# Patient Record
Sex: Female | Born: 1975 | Race: White | Hispanic: No | Marital: Married | Smoking: Never smoker
Health system: Southern US, Community
[De-identification: ages and names within clinical notes are randomized; demographics above are authoritative.]

## PROBLEM LIST (undated history)

## (undated) DIAGNOSIS — K219 Gastro-esophageal reflux disease without esophagitis: Secondary | ICD-10-CM

## (undated) HISTORY — DX: Gastro-esophageal reflux disease without esophagitis: K21.9

---

## 1997-01-08 HISTORY — PX: WISDOM TOOTH EXTRACTION: SHX21

## 2004-10-24 ENCOUNTER — Other Ambulatory Visit: Admission: RE | Admit: 2004-10-24 | Discharge: 2004-10-24 | Payer: Self-pay | Admitting: Obstetrics and Gynecology

## 2005-01-14 ENCOUNTER — Inpatient Hospital Stay (HOSPITAL_COMMUNITY): Admission: AD | Admit: 2005-01-14 | Discharge: 2005-01-14 | Payer: Self-pay | Admitting: Obstetrics and Gynecology

## 2005-02-15 ENCOUNTER — Other Ambulatory Visit: Admission: RE | Admit: 2005-02-15 | Discharge: 2005-02-15 | Payer: Self-pay | Admitting: Obstetrics and Gynecology

## 2005-05-14 ENCOUNTER — Inpatient Hospital Stay (HOSPITAL_COMMUNITY): Admission: RE | Admit: 2005-05-14 | Discharge: 2005-05-17 | Payer: Self-pay | Admitting: Obstetrics and Gynecology

## 2007-07-21 ENCOUNTER — Emergency Department (HOSPITAL_COMMUNITY): Admission: EM | Admit: 2007-07-21 | Discharge: 2007-07-21 | Payer: Self-pay | Admitting: Emergency Medicine

## 2007-11-24 ENCOUNTER — Emergency Department (HOSPITAL_COMMUNITY): Admission: EM | Admit: 2007-11-24 | Discharge: 2007-11-24 | Payer: Self-pay | Admitting: Family Medicine

## 2007-12-09 ENCOUNTER — Emergency Department (HOSPITAL_COMMUNITY): Admission: EM | Admit: 2007-12-09 | Discharge: 2007-12-09 | Payer: Self-pay | Admitting: Family Medicine

## 2008-05-24 ENCOUNTER — Emergency Department (HOSPITAL_COMMUNITY): Admission: EM | Admit: 2008-05-24 | Discharge: 2008-05-24 | Payer: Self-pay | Admitting: Emergency Medicine

## 2009-04-13 ENCOUNTER — Observation Stay (HOSPITAL_COMMUNITY): Admission: AD | Admit: 2009-04-13 | Discharge: 2009-04-14 | Payer: Self-pay | Admitting: Obstetrics and Gynecology

## 2009-04-13 ENCOUNTER — Emergency Department (HOSPITAL_COMMUNITY): Admission: EM | Admit: 2009-04-13 | Discharge: 2009-04-13 | Payer: Self-pay | Admitting: Emergency Medicine

## 2009-06-04 ENCOUNTER — Inpatient Hospital Stay (HOSPITAL_COMMUNITY): Admission: AD | Admit: 2009-06-04 | Discharge: 2009-06-06 | Payer: Self-pay | Admitting: Obstetrics and Gynecology

## 2009-06-10 ENCOUNTER — Ambulatory Visit: Admission: RE | Admit: 2009-06-10 | Discharge: 2009-06-10 | Payer: Self-pay | Admitting: Obstetrics and Gynecology

## 2010-03-19 ENCOUNTER — Inpatient Hospital Stay (INDEPENDENT_AMBULATORY_CARE_PROVIDER_SITE_OTHER)
Admission: RE | Admit: 2010-03-19 | Discharge: 2010-03-19 | Disposition: A | Discharge status: Home / Self Care | Payer: 59 | Source: Ambulatory Visit | Attending: Family Medicine | Admitting: Family Medicine

## 2010-03-19 DIAGNOSIS — J029 Acute pharyngitis, unspecified: Secondary | ICD-10-CM

## 2010-03-19 LAB — POCT INFECTIOUS MONO SCREEN: Mono Screen: NEGATIVE

## 2010-03-27 LAB — RPR: RPR Ser Ql: NONREACTIVE

## 2010-03-27 LAB — CBC
MCV: 94 fL (ref 78.0–100.0)
WBC: 8 10*3/uL (ref 4.0–10.5)

## 2010-03-29 LAB — DIFFERENTIAL
Basophils Absolute: 0 10*3/uL (ref 0.0–0.1)
Eosinophils Absolute: 0 10*3/uL (ref 0.0–0.7)
Eosinophils Relative: 0 % (ref 0–5)
Eosinophils Relative: 0 % (ref 0–5)
Lymphocytes Relative: 14 % (ref 12–46)
Lymphocytes Relative: 14 % (ref 12–46)
Lymphs Abs: 2.2 10*3/uL (ref 0.7–4.0)
Monocytes Relative: 6 % (ref 3–12)
Neutro Abs: 12.6 10*3/uL — ABNORMAL HIGH (ref 1.7–7.7)

## 2010-03-29 LAB — CBC
HCT: 34.7 % — ABNORMAL LOW (ref 36.0–46.0)
HCT: 38.2 % (ref 36.0–46.0)
MCHC: 34.2 g/dL (ref 30.0–36.0)
MCV: 96.2 fL (ref 78.0–100.0)
Platelets: 287 10*3/uL (ref 150–400)
RBC: 4 MIL/uL (ref 3.87–5.11)
RDW: 12.5 % (ref 11.5–15.5)
RDW: 12.8 % (ref 11.5–15.5)
WBC: 15.6 10*3/uL — ABNORMAL HIGH (ref 4.0–10.5)

## 2010-05-26 NOTE — Discharge Summary (Signed)
Traci White, Traci White                ACCOUNT NO.:  [id number]   MEDICAL RECORD NO.:  [id number]          PATIENT TYPE:  INP   LOCATION:  9107                          FACILITY:  [facility]   PHYSICIAN:  Guy Sandifer. Henderson Cloud, M.D. DATE OF BIRTH:  02-05-1975   DATE OF ADMISSION:  05/14/2005  DATE OF DISCHARGE:  05/17/2005                                 DISCHARGE SUMMARY   ADMITTING DIAGNOSIS:  1.  Intrauterine pregnancy at term.  2.  Low-lying placenta.   DISCHARGE DIAGNOSIS:  1.  Status post low transverse cesarean section.  2.  Viable female infant.   PROCEDURE:  Primary low transverse cesarean section.   REASON FOR ADMISSION:  Please see written H&P.   HOSPITAL COURSE:  Patient is a 35 year old primigravida that was admitted to  [facility] for a scheduled cesarean section.  Patient had  known low-lying placenta and was scheduled for cesarean delivery.  Patient  presented to [facility] that morning and was taken to the  operating room where spinal anesthesia was administered without difficulty.  A low transverse incision was made with delivery of a viable female infant  weighing 8 pounds 5 ounces, Apgars of 9 at one minute, 9 at five minutes.  Patient tolerated procedure well and was taken to the recovery room in  stable condition.  On postoperative day one patient was without complaint.  Vital signs were stable.  She was afebrile.  Abdomen soft with good return  of bowel function.  Fundus was firm and nontender.  Abdominal dressing was  noted to be partially saturated with old blood.  Dressing was removed and  was observed to have no active bleeding.  Patient was ambulating well.  Laboratory findings revealed hemoglobin of 12.9, platelet count of 204,000,  WBC count of 12.  On postoperative day two patient did complain of some  soreness.  Vital signs were stable.  She was afebrile.  Abdomen was soft.  Fundus was firm and nontender.  Incision was clean,  dry, and intact.  She  was ambulating well and tolerating a regular diet without complaints of  nausea and vomiting.  On postoperative day three patient was without  complaint.  Vital signs remained stable.  She was afebrile.  Abdomen soft.  Fundus firm and nontender.  Incision was clean, dry, and intact.  Staples  removed and patient was discharged home.   CONDITION ON DISCHARGE:  Good.   DIET:  Regular, as tolerated.   ACTIVITY:  No heavy lifting, no driving x2 weeks, no vaginal entry.   FOLLOW UP:  Patient to follow up in the office in one to two weeks for  incision check.  She is to call for temperature greater than 100 degrees,  persistent nausea and vomiting, heavy vaginal bleeding and/or redness or  drainage from the incisional site.   DISCHARGE MEDICATIONS:  1.  Percocet 5/325 #30 one p.o. every four to six hours p.r.n.  2.  Motrin 600 mg every six hours.  3.  Prenatal vitamins one p.o. daily.  4.  Colace one p.o. daily p.r.n.  Julio Sicks, N.P.      Guy Sandifer. Henderson Cloud, M.D.  Electronically Signed    CC/MEDQ  D:  06/05/2005  T:  06/05/2005  Job:  [id number]

## 2010-05-26 NOTE — Op Note (Signed)
Traci White, RASKE                ACCOUNT NO.:  [id number]   MEDICAL RECORD NO.:  [id number]          PATIENT TYPE:  INP   LOCATION:  9107                          [facility]ILITY:  [facility]   PHYSICIAN:  Michelle L. Grewal, M.D.DATE OF BIRTH:  06-Nov-1975   DATE OF PROCEDURE:  05/14/2005  DATE OF DISCHARGE:                                 OPERATIVE REPORT   PREOPERATIVE DIAGNOSIS:  Intrauterine pregnancy at term and low lying  placenta.   POSTOPERATIVE DIAGNOSIS:  Intrauterine pregnancy at term and low lying  placenta.   PROCEDURE:  Primary low transverse cesarean section.   SURGEON:  Michelle L. Vincente Poli, M.D.   ANESTHESIA:  Spinal.   SPECIMENS:  Female infant, cephalic presentation Apgars 9 at one minute and  9 at five minutes.   ESTIMATED BLOOD LOSS:  500 mL.   COMPLICATIONS:  None.   PROCEDURE:  Patient was taken to the operating room.  She was given spinal.  She is prepped and draped in usual sterile fashion.  A Foley catheter was  inserted.  A low transverse incision is made, carried down to the fascia.  Fascia scored in midline and extended laterally. Rectus muscles were  separated in midline and the peritoneum was entered bluntly.  Peritoneal  incision was stretched.  The bladder blade was inserted. The lower uterine  segment identified and the bladder flap was created sharply and then  digitally.  The bladder blade was readjusted.  A low transverse incision is  made in the uterus.  The uterus was entered with the hemostat.  Amniotic  fluid was clear and the baby was in cephalic presentation was delivered  quite easily and was a female infant with Apgars of 9 at one minute and 9 at  five minutes. The cord was clamped and cut. The baby was handed to _____  from the NICU and subsequently taken to the newborn nursery. Exploration of  the uterus revealed the placenta was posterior and very low lying and after  cord blood was obtained manually, we actually expressed the placenta.  It  appeared to be normal with a three-vessel cord.  The uterus was exteriorized  and cleared of all clots and debris.  The uterus was closed in one layer  using 0 chromic in continuous running locked stitch.  Irrigation performed.  Hemostasis was noted.  The peritoneum was closed using 0 Vicryl.  The rectus  muscles were reapproximated using same 0 Vicryl.  The fascia closed using 0  Vicryl x2 starting at each corner and meeting in the midline. After  irrigation of the subcutaneous layer, the skin was closed with staples.  All  sponge, lap and instrument counts were correct x2.  The patient went to  recovery room stable condition.     Michelle L. Vincente Poli, M.D.  Electronically Signed    MLG/MEDQ  D:  05/14/2005  T:  05/15/2005  Job:  [id number]

## 2010-10-05 LAB — POCT URINALYSIS DIP (DEVICE)
Bilirubin Urine: NEGATIVE
Nitrite: POSITIVE — AB
Protein, ur: NEGATIVE

## 2010-10-05 LAB — POCT PREGNANCY, URINE: Preg Test, Ur: NEGATIVE

## 2011-03-29 ENCOUNTER — Other Ambulatory Visit: Payer: Self-pay | Admitting: Family Medicine

## 2011-03-29 ENCOUNTER — Ambulatory Visit
Admission: RE | Admit: 2011-03-29 | Discharge: 2011-03-29 | Disposition: A | Discharge status: Home / Self Care | Payer: 59 | Source: Ambulatory Visit | Attending: Family Medicine | Admitting: Family Medicine

## 2011-03-29 DIAGNOSIS — R52 Pain, unspecified: Secondary | ICD-10-CM

## 2015-01-14 MED FILL — PANTOPRAZOLE SOD DR 40 MG T: 40 | 90 days supply | Qty: 90 | Fill #1

## 2015-01-14 MED FILL — PAZEO 0.7% EYE DROPS: 0.7 | 25 days supply | Qty: 3 | Fill #4

## 2015-02-06 DIAGNOSIS — K1121 Acute sialoadenitis: Secondary | ICD-10-CM | POA: Diagnosis not present

## 2015-02-07 MED FILL — PAZEO 0.7% EYE DROPS: 0.7 | 25 days supply | Qty: 3 | Fill #5

## 2015-02-08 DIAGNOSIS — K1120 Sialoadenitis, unspecified: Secondary | ICD-10-CM | POA: Diagnosis not present

## 2015-03-04 MED FILL — PAZEO 0.7% EYE DROPS: 0.7 | 25 days supply | Qty: 3 | Fill #6

## 2015-03-10 MED FILL — DOXYCYCLINE HYC 50 MG CAP: 50 | 90 days supply | Qty: 90 | Fill #5

## 2015-03-10 MED FILL — LARIN FE 1-20 TABLET: 1-20 | 84 days supply | Qty: 84 | Fill #2

## 2015-03-25 MED FILL — PAZEO 0.7% EYE DROPS: 0.7 | 25 days supply | Qty: 3 | Fill #0

## 2015-03-28 DIAGNOSIS — B300 Keratoconjunctivitis due to adenovirus: Secondary | ICD-10-CM | POA: Diagnosis not present

## 2015-03-28 DIAGNOSIS — H04123 Dry eye syndrome of bilateral lacrimal glands: Secondary | ICD-10-CM | POA: Diagnosis not present

## 2015-04-18 MED FILL — PAZEO 0.7% EYE DROPS: 0.7 | 25 days supply | Qty: 3 | Fill #1

## 2015-04-18 MED FILL — PANTOPRAZOLE SOD DR 40 MG T: 40 | 90 days supply | Qty: 90 | Fill #2

## 2015-04-20 DIAGNOSIS — Z8719 Personal history of other diseases of the digestive system: Secondary | ICD-10-CM | POA: Diagnosis not present

## 2015-04-20 DIAGNOSIS — Z09 Encounter for follow-up examination after completed treatment for conditions other than malignant neoplasm: Secondary | ICD-10-CM | POA: Diagnosis not present

## 2015-05-10 MED FILL — PAZEO 0.7% EYE DROPS: 0.7 | 25 days supply | Qty: 3 | Fill #2

## 2015-05-10 MED FILL — RESTASIS 0.05% EYE EMULSION: 0.05 | 90 days supply | Qty: 180 | Fill #2

## 2015-06-03 MED FILL — PAZEO 0.7% EYE DROPS: 0.7 | 25 days supply | Qty: 3 | Fill #3

## 2015-06-03 MED FILL — NORETHIN-ESTRAD-FERR 1-0.02: 1-20 | 84 days supply | Qty: 84 | Fill #3

## 2015-06-27 MED FILL — DOXYCYCLINE HYC 50 MG CAP: 50 | 90 days supply | Qty: 90 | Fill #6

## 2015-06-27 MED FILL — PAZEO 0.7% EYE DROPS: 0.7 | 25 days supply | Qty: 3 | Fill #4

## 2015-07-20 MED FILL — PAZEO 0.7% EYE DROPS: 0.7 | 25 days supply | Qty: 3 | Fill #5

## 2015-07-26 MED FILL — XIIDRA 5% EYE DROPS: 5 | 30 days supply | Qty: 60 | Fill #0

## 2015-08-17 MED FILL — PANTOPRAZOLE SOD DR 40 MG T: 40 | 90 days supply | Qty: 90 | Fill #3

## 2015-08-17 MED FILL — NORETHIN-ESTRAD-FERR 1-0.02: 1-20 | 84 days supply | Qty: 84 | Fill #4

## 2015-08-21 DIAGNOSIS — H9202 Otalgia, left ear: Secondary | ICD-10-CM | POA: Diagnosis not present

## 2015-08-21 DIAGNOSIS — R609 Edema, unspecified: Secondary | ICD-10-CM | POA: Diagnosis not present

## 2015-09-13 DIAGNOSIS — Z6828 Body mass index (BMI) 28.0-28.9, adult: Secondary | ICD-10-CM | POA: Diagnosis not present

## 2015-09-13 DIAGNOSIS — Z01419 Encounter for gynecological examination (general) (routine) without abnormal findings: Secondary | ICD-10-CM | POA: Diagnosis not present

## 2015-09-19 ENCOUNTER — Ambulatory Visit (INDEPENDENT_AMBULATORY_CARE_PROVIDER_SITE_OTHER): Payer: 59 | Admitting: Podiatry

## 2015-09-20 DIAGNOSIS — Z131 Encounter for screening for diabetes mellitus: Secondary | ICD-10-CM | POA: Diagnosis not present

## 2015-09-20 DIAGNOSIS — Z1321 Encounter for screening for nutritional disorder: Secondary | ICD-10-CM | POA: Diagnosis not present

## 2015-09-20 DIAGNOSIS — Z1329 Encounter for screening for other suspected endocrine disorder: Secondary | ICD-10-CM | POA: Diagnosis not present

## 2015-09-20 DIAGNOSIS — Z13220 Encounter for screening for lipoid disorders: Secondary | ICD-10-CM | POA: Diagnosis not present

## 2015-09-21 DIAGNOSIS — H16223 Keratoconjunctivitis sicca, not specified as Sjogren's, bilateral: Secondary | ICD-10-CM | POA: Diagnosis not present

## 2015-09-21 DIAGNOSIS — H01022 Squamous blepharitis right lower eyelid: Secondary | ICD-10-CM | POA: Diagnosis not present

## 2015-09-21 DIAGNOSIS — H01025 Squamous blepharitis left lower eyelid: Secondary | ICD-10-CM | POA: Diagnosis not present

## 2015-09-30 ENCOUNTER — Ambulatory Visit (INDEPENDENT_AMBULATORY_CARE_PROVIDER_SITE_OTHER): Payer: 59 | Admitting: Podiatry

## 2015-09-30 ENCOUNTER — Encounter: Payer: Self-pay | Admitting: Podiatry

## 2015-09-30 ENCOUNTER — Ambulatory Visit (INDEPENDENT_AMBULATORY_CARE_PROVIDER_SITE_OTHER): Payer: 59

## 2015-09-30 VITALS — BP 125/88 | HR 107 | Resp 16 | Ht 63.25 in | Wt 165.0 lb

## 2015-09-30 DIAGNOSIS — M79671 Pain in right foot: Secondary | ICD-10-CM | POA: Diagnosis not present

## 2015-09-30 DIAGNOSIS — M79672 Pain in left foot: Secondary | ICD-10-CM | POA: Diagnosis not present

## 2015-09-30 DIAGNOSIS — M21619 Bunion of unspecified foot: Secondary | ICD-10-CM

## 2015-09-30 NOTE — Progress Notes (Signed)
   Subjective:    Patient ID: Traci White, female    DOB: 05/07/1975, 40 y.o.   MRN: [id number]  HPI Chief Complaint  Patient presents with  . Foot Pain    Bilateral; Bunions; pt stated, "Pain comes and goes; Right foot hurts more than left"      Review of Systems  Constitutional: Positive for fatigue.  Musculoskeletal: Positive for arthralgias.  All other systems reviewed and are negative.      Objective:   Physical Exam        Assessment & Plan:

## 2015-10-03 NOTE — Progress Notes (Signed)
Subjective:     Patient ID: Traci White, female   DOB: June 16, 1975, 40 y.o.   MRN: [id number]  HPI patient presents with structural bunion deformity right over left with redness and pain and states that she's been trying wider shoes soaks without relief of symptoms and it's been present for several years and getting worse   Review of Systems  All other systems reviewed and are negative.      Objective:   Physical Exam  Constitutional: She is oriented to person, place, and time.  Cardiovascular: Intact distal pulses.   Musculoskeletal: Normal range of motion.  Neurological: She is oriented to person, place, and time.  Skin: Skin is warm.  Nursing note and vitals reviewed.  neurovascular status found to be intact with muscle strength adequate range of motion within normal limits with patient noted to have redness and pain first metatarsal right over left around the first metatarsal bone with pain upon palpation and deviation of the hallux against the second toe. 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 I do think distal osteotomy can be accomplished and I reviewed procedure with patient and she wants this done and at this time she'll reappoint for consult and is scheduled for outpatient surgery. Gave her all instructions as to what we will need to be done and we will see her back for extensive consultation  X-ray report indicates there is structural bunion deformity bilateral with elevation of the angle between the first and second metatarsal 16 on right and 15 on the left approximate

## 2015-10-04 MED FILL — DOXYCYCLINE HYC 50 MG CAP: 50 | 90 days supply | Qty: 90 | Fill #0

## 2015-10-13 ENCOUNTER — Ambulatory Visit (INDEPENDENT_AMBULATORY_CARE_PROVIDER_SITE_OTHER): Payer: 59 | Admitting: Podiatry

## 2015-10-13 ENCOUNTER — Encounter: Payer: Self-pay | Admitting: Podiatry

## 2015-10-13 DIAGNOSIS — M21619 Bunion of unspecified foot: Secondary | ICD-10-CM | POA: Diagnosis not present

## 2015-10-13 MED FILL — PAZEO 0.7% EYE DROPS: 0.7 | 25 days supply | Qty: 3 | Fill #6

## 2015-10-13 NOTE — Patient Instructions (Signed)
Pre-Operative Instructions  Congratulations, you have decided to take an important step to improving your quality of life.  You can be assured that the doctors of [facility] will be with you every step of the way.  1. Plan to be at the surgery center/hospital at least 1 (one) hour prior to your scheduled time unless otherwise directed by the surgical center/hospital staff.  You must have a responsible adult accompany you, remain during the surgery and drive you home.  Make sure you have directions to the surgical center/hospital and know how to get there on time. 2. For hospital based surgery you will need to obtain a history and physical form from your family physician within 1 month prior to the date of surgery- we will give you a form for you primary physician.  3. We make every effort to accommodate the date you request for surgery.  There are however, times where surgery dates or times have to be moved.  We will contact you as soon as possible if a change in schedule is required.   4. No Aspirin/Ibuprofen for one week before surgery.  If you are on aspirin, any non-steroidal anti-inflammatory medications (Mobic, Aleve, Ibuprofen) you should stop taking it 7 days prior to your surgery.  You make take Tylenol  For pain prior to surgery.  5. Medications- If you are taking daily heart and blood pressure medications, seizure, reflux, allergy, asthma, anxiety, pain or diabetes medications, make sure the surgery center/hospital is aware before the day of surgery so they may notify you which medications to take or avoid the day of surgery. 6. No food or drink after midnight the night before surgery unless directed otherwise by surgical center/hospital staff. 7. No alcoholic beverages 24 hours prior to surgery.  No smoking 24 hours prior to or 24 hours after surgery. 8. Wear loose pants or shorts- loose enough to fit over bandages, boots, and casts. 9. No slip on shoes, sneakers are best. 10. Bring  your boot with you to the surgery center/hospital.  Also bring crutches or a walker if your physician has prescribed it for you.  If you do not have this equipment, it will be provided for you after surgery. 11. If you have not been contracted by the surgery center/hospital by the day before your surgery, call to confirm the date and time of your surgery. 12. Leave-time from work may vary depending on the type of surgery you have.  Appropriate arrangements should be made prior to surgery with your employer. 13. Prescriptions will be provided immediately following surgery by your doctor.  Have these filled as soon as possible after surgery and take the medication as directed. 14. Remove nail polish on the operative foot. 15. Wash the night before surgery.  The night before surgery wash the foot and leg well with the antibacterial soap provided and water paying special attention to beneath the toenails and in between the toes.  Rinse thoroughly with water and dry well with a towel.  Perform this wash unless told not to do so by your physician.  Enclosed: 1 Ice pack (please put in freezer the night before surgery)   1 Hibiclens skin cleaner   Pre-op Instructions  If you have any questions regarding the instructions, do not hesitate to call our office.  [place]: [address] [place], [place] [address] [phone]  [place]: [address] , [place] [address] [phone]  [place]: [address]  [place], [place] [address] [phone]   Dr.   Norman Regal DPM, Dr. Matthew Wagoner DPM, Dr. M. Todd Hyatt DPM, Dr. Titorya Stover DPM 

## 2015-10-16 NOTE — Progress Notes (Signed)
Subjective:     Patient ID: Traci MacadamiaSusan H White, female   DOB: April 25, 1975, 40 y.o.   MRN: [id number]  HPI patient presents stating she's ready to get her bunion done and at this point wants to do the right one first as it bothers her more   Review of Systems     Objective:   Physical Exam Neurovascular status intact with patient found to have painful right and left first MPJ with redness around the joint surface and pain when palpated    Assessment:     Structural bunion deformity right over left with elevation of the intermetatarsal angle    Plan:     H&P condition reviewed and recommended correction. Patient wants surgery and I allowed her to read consent form reviewing alternative treatments complications and she understands what we'll be done and all complications as listed. She is willing to accept risk signs consent form and understands recovery is a proximally 6 months and is dispensed air fracture walker at this time with instructions on usage. Patient is encouraged to call with any questions and will be seen back for surgery in the next 4 weeks or earlier if she can change her schedule

## 2015-10-18 DIAGNOSIS — M3500 Sicca syndrome, unspecified: Secondary | ICD-10-CM | POA: Diagnosis not present

## 2015-10-18 DIAGNOSIS — R5383 Other fatigue: Secondary | ICD-10-CM | POA: Diagnosis not present

## 2015-10-18 DIAGNOSIS — R0602 Shortness of breath: Secondary | ICD-10-CM | POA: Diagnosis not present

## 2015-10-18 DIAGNOSIS — K111 Hypertrophy of salivary gland: Secondary | ICD-10-CM | POA: Diagnosis not present

## 2015-10-21 ENCOUNTER — Telehealth: Payer: Self-pay | Admitting: *Deleted

## 2015-10-21 NOTE — Telephone Encounter (Signed)
"  I'm going to have surgery by Dr. Charlsie Merlesegal on November 21.  I just have questions.  I called the surgery center and they have not received any of my documents.  I believe that is because I just signed the consents last week sometime.  I am trying to figure out the cost of the surgery.  They said they need the documents in order to tell me that.  I just want to make sure everything is still a go for that date."

## 2015-10-27 NOTE — Telephone Encounter (Signed)
I am returning your call.  We do have you scheduled for November 21.  Information was sent to the surgical center today.  I apologize for the delay in calling you back.  We are short staffed so I have been assisting the doctors.  If you have any further questions give me a call back on Monday.  I will be out of the office tomorrow.

## 2015-11-01 ENCOUNTER — Telehealth: Payer: Self-pay | Admitting: *Deleted

## 2015-11-01 NOTE — Telephone Encounter (Signed)
"  I was just returning your call.  Thank you so much for getting that information over to the surgical center.  I am sorry if I was being a pest.  I was trying to get an estimate of my costs.  I am assuming they have sent my FMLA papers and short term disability stuff was sent to you guys.  If you could check on that for me and let me know if I need to send anything else that would be great."

## 2015-11-02 ENCOUNTER — Telehealth: Payer: Self-pay | Admitting: *Deleted

## 2015-11-02 MED FILL — RESTASIS 0.05% EYE EMULSION: 0.05 | 90 days supply | Qty: 180 | Fill #0

## 2015-11-02 NOTE — Telephone Encounter (Signed)
"  I know I have become a thorn in your side.  [employer] and [employer] have been calling me asking for stuff from you guys.  They keep telling me I need to call and see if it is being taken care of.  So I apologize."  We do have the forms.  Traci CrockerJessica White has been working on them today.  "Okay great, thank you so much for your help."

## 2015-11-08 DIAGNOSIS — R5383 Other fatigue: Secondary | ICD-10-CM | POA: Diagnosis not present

## 2015-11-08 DIAGNOSIS — M3500 Sicca syndrome, unspecified: Secondary | ICD-10-CM | POA: Diagnosis not present

## 2015-11-08 DIAGNOSIS — K111 Hypertrophy of salivary gland: Secondary | ICD-10-CM | POA: Diagnosis not present

## 2015-11-09 HISTORY — PX: BUNIONECTOMY: SHX129

## 2015-11-16 ENCOUNTER — Encounter: Payer: 59 | Attending: Family Medicine | Admitting: Skilled Nursing Facility1

## 2015-11-16 ENCOUNTER — Encounter: Payer: Self-pay | Admitting: Skilled Nursing Facility1

## 2015-11-16 DIAGNOSIS — Z713 Dietary counseling and surveillance: Secondary | ICD-10-CM | POA: Insufficient documentation

## 2015-11-16 NOTE — Progress Notes (Signed)
Patient was seen on 11/16/2015 for the Weight Loss Class at the [facility]. The following learning objectives were met by the patient during this class:   Describe healthy choices in each food group  Describe portion size of foods  Use plate method for meal planning  Demonstrate how to read Nutrition Facts food label  Set realistic goals for weight loss, diet changes, and physical activity.   Goals:  1. Make healthy food choices in each food group.  2. Reduce portion size of foods.  3. Increase fruit and vegetable intake.  4. Use plate method for meal planning.  5. Increase physical activity.    Handouts given:   1. Nutrition Strategies for Weight Loss   2. Meal plan/portion card   3. MyPlate Planner   4. Weight Management Recipe Resources   5. Bake, Broil, Spring

## 2015-11-18 ENCOUNTER — Telehealth: Payer: Self-pay | Admitting: *Deleted

## 2015-11-18 MED FILL — PANTOPRAZOLE SOD DR 40 MG T: 40 | 90 days supply | Qty: 90 | Fill #0

## 2015-11-18 MED FILL — PAZEO 0.7% EYE DROPS: 0.7 | 25 days supply | Qty: 3 | Fill #0

## 2015-11-18 MED FILL — NORETHIN-ESTRAD-FERR 1-0.02: 1-20 | 84 days supply | Qty: 84 | Fill #0

## 2015-11-18 NOTE — Telephone Encounter (Signed)
"  I have some questions.  Please call."    I'm returning your call.  How can I help you?  "I take fish oil for dry eye is it okay for me to continue taking this prior to surgery scheduled for 11/29/2015?  Yes, it is okay to continue to take this.  "What about Ibuprofen, should I not take it?" Try to avoid taking Ibuprofen if you can.  " Oh, it's no problem.  I have the scrub brush.  It says to use it the night before.  Is it okay for me to use it that morning.  I normally take my shower in the morning."  Please use it the night before.  "Okay, thank you."

## 2015-11-28 ENCOUNTER — Telehealth: Payer: Self-pay | Admitting: *Deleted

## 2015-11-28 NOTE — Telephone Encounter (Signed)
Pt asked if she needed to purchase a bag to shower after surgery. I told pt she could purchase a cast bag here or any pharmacy.

## 2015-11-29 ENCOUNTER — Encounter: Payer: Self-pay | Admitting: Podiatry

## 2015-11-29 DIAGNOSIS — M2011 Hallux valgus (acquired), right foot: Secondary | ICD-10-CM | POA: Diagnosis not present

## 2015-11-29 DIAGNOSIS — M722 Plantar fascial fibromatosis: Secondary | ICD-10-CM | POA: Diagnosis not present

## 2015-11-29 DIAGNOSIS — M21611 Bunion of right foot: Secondary | ICD-10-CM | POA: Diagnosis not present

## 2015-11-29 DIAGNOSIS — K219 Gastro-esophageal reflux disease without esophagitis: Secondary | ICD-10-CM | POA: Diagnosis not present

## 2015-11-29 MED FILL — OXYCODONE-APAP 10-325: 10-325 | 5 days supply | Qty: 30 | Fill #0

## 2015-11-29 MED FILL — PROMETHAZINE 25 MG TABLET: 25 | 7 days supply | Qty: 30 | Fill #0

## 2015-12-05 ENCOUNTER — Telehealth: Payer: Self-pay | Admitting: *Deleted

## 2015-12-05 MED ORDER — OXYCODONE-ACETAMINOPHEN 5-325 MG PO TABS: ORAL_TABLET | ORAL | 0 refills | Status: DC

## 2015-12-05 MED FILL — OXYCODONE W/APAP 5/325 TAB: 5-325 | 5 days supply | Qty: 30 | Fill #0

## 2015-12-05 NOTE — Telephone Encounter (Addendum)
Pt called 12/02/2015 our office was closed for the holiday. Pt called for refill of Percocet 5/325. I refilled and pt states she has tried to make it to every 8 hours, but it is painful. I told pt to take as directed every 4-6 hours but may use OTC Ibuprofen as package directs in between Percocet if she tolerates Ibuprofen. Pt states she can use Ibuprofen, and will send her father to pick up the Percocet rx. 12/12/2015-Pt asked for a handicap sticker for work. Informed pt she could pick up 6 month handicap sticker from our office.01/12/2016-Pt states she had surgery 11/29/2015 and has some questions about how her foot feels. Pt states was put in compression sock on 01/05/2016, and now has numbness to the little toe. I asked pt if the compression sock was pulled over the tip of the toe, with the elastic band not resting on the little toe or just behind the little toe. Pt state the elastic band is not on the toe. I asked pt if she slept in the compression sock and she said no she puts on in the morning after her shower. I suggested pt shower at night and put the compression sock on prior to getting out of bed, because once her foot was below her heart on the floor and then when hit with hot water it would begin to swell. Pt states understanding and I told her if that did not work to clip the elastic band on top of the foot and see if that didn't help.

## 2015-12-09 ENCOUNTER — Ambulatory Visit (INDEPENDENT_AMBULATORY_CARE_PROVIDER_SITE_OTHER): Payer: 59 | Admitting: Podiatry

## 2015-12-09 ENCOUNTER — Ambulatory Visit (INDEPENDENT_AMBULATORY_CARE_PROVIDER_SITE_OTHER): Payer: 59

## 2015-12-09 VITALS — Temp 97.1°F

## 2015-12-09 DIAGNOSIS — Z9889 Other specified postprocedural states: Secondary | ICD-10-CM

## 2015-12-09 DIAGNOSIS — M21619 Bunion of unspecified foot: Secondary | ICD-10-CM

## 2015-12-09 NOTE — Progress Notes (Signed)
DOS 11.21.2017 [place] Bunionectomy (cutting and moving bone) with Fixation 1st Metatarsal

## 2015-12-11 NOTE — Progress Notes (Signed)
Subjective:     Patient ID: Traci White, female   DOB: [birth year], 40 y.o.   MRN: [id number]  HPI patient states she's doing real well with her right foot with minimal discomfort or swelling   Review of Systems     Objective:   Physical Exam Neurovascular status intact muscle strength adequate with patient found to have mild edema in the right forefoot with good alignment of the first MPJ wound edges that are well coapted and no movement noted at the current time with good range of motion of the joint surface    Assessment:     Doing well post osteotomy first metatarsal right with negative Homans sign also noted    Plan:     H&P conditions reviewed and at this point recommended continued elevation compression immobilization with boot reapplied and instructions on continued utilization of immobilization devices. Reappoint to recheck  X-ray indicates the osteotomy is in place with no signs of movement at the current time with fixation in place

## 2015-12-12 MED FILL — PAZEO 0.7% EYE DROPS: 0.7 | 25 days supply | Qty: 3 | Fill #1

## 2015-12-16 ENCOUNTER — Encounter: Payer: Self-pay | Admitting: Podiatry

## 2016-01-05 ENCOUNTER — Ambulatory Visit (INDEPENDENT_AMBULATORY_CARE_PROVIDER_SITE_OTHER): Payer: 59

## 2016-01-05 ENCOUNTER — Ambulatory Visit (INDEPENDENT_AMBULATORY_CARE_PROVIDER_SITE_OTHER): Payer: 59 | Admitting: Podiatry

## 2016-01-05 DIAGNOSIS — M2010 Hallux valgus (acquired), unspecified foot: Secondary | ICD-10-CM | POA: Diagnosis not present

## 2016-01-05 DIAGNOSIS — M21619 Bunion of unspecified foot: Secondary | ICD-10-CM

## 2016-01-05 DIAGNOSIS — Z9889 Other specified postprocedural states: Secondary | ICD-10-CM

## 2016-01-06 NOTE — Progress Notes (Signed)
Subjective:     Patient ID: Traci White, female   DOB: Aug 16, 1975, 40 y.o.   MRN: [id number]  HPI patient presents stating that she's doing very well with her surgery   Review of Systems     Objective:   Physical Exam Neurovascular status intact negative Homan sign was noted and well structured first metatarsal with good alignment and good range of motion of the first MPJ    Assessment:     Doing well post osteotomy    Plan:     Advised on physical therapy anti-inflammatories and continued increase in activity levels. Patient be seen back over the next several months or as needed  X-ray report indicates ostium is healing well fixation in place with good structure

## 2016-01-19 MED FILL — DOXYCYCLINE HYC 50 MG CAP: 50 | 90 days supply | Qty: 90 | Fill #1

## 2016-01-19 MED FILL — PAZEO 0.7% EYE DROPS: 0.7 | 25 days supply | Qty: 3 | Fill #2

## 2016-02-01 ENCOUNTER — Ambulatory Visit (INDEPENDENT_AMBULATORY_CARE_PROVIDER_SITE_OTHER): Payer: Self-pay | Admitting: Podiatry

## 2016-02-01 ENCOUNTER — Ambulatory Visit (INDEPENDENT_AMBULATORY_CARE_PROVIDER_SITE_OTHER): Payer: 59

## 2016-02-01 DIAGNOSIS — M21619 Bunion of unspecified foot: Secondary | ICD-10-CM

## 2016-02-01 DIAGNOSIS — M2010 Hallux valgus (acquired), unspecified foot: Secondary | ICD-10-CM

## 2016-02-01 NOTE — Progress Notes (Signed)
Subjective:     Patient ID: Traci White, female   DOB: [birth year], 41 y.o.   MRN: [id number]  HPI patient states I'm doing great with my right foot and I want to get the left foot fixed but I have to wait till the fall   Review of Systems     Objective:   Physical Exam Neurovascular status intact negative Homans sign noted with patient's right foot doing very well with wound edges well coapted and good alignment    Assessment:     Doing well post osteotomy first metatarsal right    Plan:     X-ray reviewed and allow patient to normal activity and she will schedule her other foot for the fall  X-rays indicate good positional component pins in place with joint congruence

## 2016-02-02 ENCOUNTER — Ambulatory Visit: Payer: 59

## 2016-02-09 MED FILL — PANTOPRAZOLE SOD DR 40 MG T: 40 | 90 days supply | Qty: 90 | Fill #1

## 2016-02-09 MED FILL — NORETHIN-ESTRAD-FERR 1-0.02: 1-20 | 84 days supply | Qty: 84 | Fill #1

## 2016-03-05 MED FILL — HYDROXYCHLOROQUINE 200 MG T: 200 | 30 days supply | Qty: 60 | Fill #0

## 2016-03-06 DIAGNOSIS — H5213 Myopia, bilateral: Secondary | ICD-10-CM | POA: Diagnosis not present

## 2016-03-06 DIAGNOSIS — H52223 Regular astigmatism, bilateral: Secondary | ICD-10-CM | POA: Diagnosis not present

## 2016-04-23 MED FILL — DOXYCYCLINE HYC 50 MG CAP: 50 | 90 days supply | Qty: 90 | Fill #2

## 2016-04-23 MED FILL — NORETHIN-ESTRAD-FERR 1-0.02: 1-20 | 84 days supply | Qty: 84 | Fill #2

## 2016-04-23 MED FILL — RESTASIS 0.05% EYE EMULSION: 0.05 | 90 days supply | Qty: 180 | Fill #1

## 2016-04-24 MED FILL — PANTOPRAZOLE SOD DR 40 MG T: 40 | 90 days supply | Qty: 90 | Fill #2

## 2016-05-01 DIAGNOSIS — E663 Overweight: Secondary | ICD-10-CM | POA: Diagnosis not present

## 2016-05-01 DIAGNOSIS — M722 Plantar fascial fibromatosis: Secondary | ICD-10-CM | POA: Diagnosis not present

## 2016-05-01 DIAGNOSIS — K111 Hypertrophy of salivary gland: Secondary | ICD-10-CM | POA: Diagnosis not present

## 2016-05-01 DIAGNOSIS — Z6829 Body mass index (BMI) 29.0-29.9, adult: Secondary | ICD-10-CM | POA: Diagnosis not present

## 2016-05-01 DIAGNOSIS — M3500 Sicca syndrome, unspecified: Secondary | ICD-10-CM | POA: Diagnosis not present

## 2016-05-01 DIAGNOSIS — R5383 Other fatigue: Secondary | ICD-10-CM | POA: Diagnosis not present

## 2016-05-08 DIAGNOSIS — R000 Tachycardia, unspecified: Secondary | ICD-10-CM | POA: Diagnosis not present

## 2016-05-08 DIAGNOSIS — M3509 Sicca syndrome with other organ involvement: Secondary | ICD-10-CM | POA: Diagnosis not present

## 2016-05-08 DIAGNOSIS — Z0000 Encounter for general adult medical examination without abnormal findings: Secondary | ICD-10-CM | POA: Diagnosis not present

## 2016-05-08 DIAGNOSIS — K219 Gastro-esophageal reflux disease without esophagitis: Secondary | ICD-10-CM | POA: Diagnosis not present

## 2016-05-17 ENCOUNTER — Encounter: Payer: Self-pay | Admitting: *Deleted

## 2016-05-25 ENCOUNTER — Ambulatory Visit: Payer: 59 | Admitting: Interventional Cardiology

## 2016-06-05 MED FILL — HYDROXYCHLOROQUINE 200 MG T: 200 | 30 days supply | Qty: 60 | Fill #1

## 2016-06-06 DIAGNOSIS — L853 Xerosis cutis: Secondary | ICD-10-CM | POA: Diagnosis not present

## 2016-06-06 DIAGNOSIS — B354 Tinea corporis: Secondary | ICD-10-CM | POA: Diagnosis not present

## 2016-06-06 MED FILL — TERBINAFINE HCL 250 MG TAB: 250 | 7 days supply | Qty: 7 | Fill #0

## 2016-06-14 MED FILL — TERBINAFINE HCL 250 MG TAB: 250 | 7 days supply | Qty: 7 | Fill #0

## 2016-06-19 ENCOUNTER — Ambulatory Visit: Payer: 59 | Admitting: Interventional Cardiology

## 2016-06-20 DIAGNOSIS — L308 Other specified dermatitis: Secondary | ICD-10-CM | POA: Diagnosis not present

## 2016-06-21 ENCOUNTER — Ambulatory Visit (INDEPENDENT_AMBULATORY_CARE_PROVIDER_SITE_OTHER): Payer: 59

## 2016-06-21 ENCOUNTER — Ambulatory Visit (INDEPENDENT_AMBULATORY_CARE_PROVIDER_SITE_OTHER): Payer: 59 | Admitting: Podiatry

## 2016-06-21 DIAGNOSIS — M79671 Pain in right foot: Secondary | ICD-10-CM | POA: Diagnosis not present

## 2016-06-21 DIAGNOSIS — Z472 Encounter for removal of internal fixation device: Secondary | ICD-10-CM | POA: Diagnosis not present

## 2016-06-22 NOTE — Progress Notes (Signed)
Subjective:    Patient ID: Traci White, female   DOB: 41 y.o.   MRN: [id number]   HPI patient states he she had her right foot 3 weeks ago and it did not came up on her first metatarsal. States she started very well with her surgery but this area is becoming painful    ROS      Objective:  Physical Exam neurovascular status intact negative Homans sign was noted with patient found to have well-healed surgical site first metatarsal right with a prominence in the proximal portion of the incision where most likely a pin has dislocated secondary to trauma     Assessment:    Abnormal pin position first metatarsal right     Plan:    Reviewed condition and at this point due to the pain and the fact that has lifted I recommended surgical intervention. Patient wants to have this fixed and I explained procedure allowing her to go over alternative treatments complications and she went ahead signed consent form at this time and is scheduled for office surgery for removal of pin right

## 2016-06-27 ENCOUNTER — Encounter: Payer: Self-pay | Admitting: Interventional Cardiology

## 2016-06-29 MED FILL — HYDROXYCHLOROQUINE 200 MG T: 200 | 30 days supply | Qty: 60 | Fill #2

## 2016-07-04 ENCOUNTER — Telehealth: Payer: Self-pay | Admitting: *Deleted

## 2016-07-04 NOTE — Telephone Encounter (Signed)
"  I'm scheduled for surgery there on Monday for a pin removal.  Do I need to remove my nail polish?"  Yes, you do need to, to prevent infection from occurring.

## 2016-07-09 ENCOUNTER — Encounter: Payer: Self-pay | Admitting: Podiatry

## 2016-07-09 ENCOUNTER — Ambulatory Visit (INDEPENDENT_AMBULATORY_CARE_PROVIDER_SITE_OTHER): Payer: 59 | Admitting: Podiatry

## 2016-07-09 VITALS — BP 105/76 | HR 104 | Temp 99.2°F | Resp 16

## 2016-07-09 DIAGNOSIS — Z472 Encounter for removal of internal fixation device: Secondary | ICD-10-CM | POA: Diagnosis not present

## 2016-07-09 NOTE — Progress Notes (Signed)
Subjective:    Patient ID: Traci White, female   DOB: 41 y.o.   MRN: [id number]   HPI patient presents for removal of pin of the right foot stating that it has really been bothering her and she's excited to get it done    ROS      Objective:  Physical Exam neurovascular status intact negative Homan sign was noted with patient found to have a prominent pin position right first metatarsal that's painful when pressed     Assessment:   Abnormal pin position right with pain      Plan:    Discussed surgical removal of pin and explained procedure and risk and today I went ahead and we anesthetized with 120 mg Xylocaine Marcaine mixture I then went ahead and I did sterile prep of the right foot and applied tourniquet and inflated tourniquet to 250 mmHg. I then using a 15 blade and made an incision of approximately 3 cm in length centered over the prominent pin position and took this through 16 he is tissue with hemostasis being acquired as necessary. I took the incision to capsule made a linear capsular incision and then debrided the capsule off the underlying bone exposed pin and removed in toto. Flushed the wound with copious vascular margins solution and sutured with 5-0 nylon applied sterile dressing and released tourniquet with capillary fill been in immediate to all digits. Patient will be seen back 2 weeks for stitch removal or earlier if needed

## 2016-07-10 ENCOUNTER — Telehealth: Payer: Self-pay | Admitting: Podiatry

## 2016-07-10 ENCOUNTER — Telehealth: Payer: Self-pay

## 2016-07-10 NOTE — Telephone Encounter (Signed)
Advised patient on post operative care of foot

## 2016-07-10 NOTE — Telephone Encounter (Signed)
Pt had in office surgery yesterday and has a few questions. Pt wants to know if she should not soak her foot like when she had outpatient surgery before. Wants to know if she can get into a pool or wait to get the sutures out. Also wants to know how long to keep foot elevated.

## 2016-07-12 NOTE — Progress Notes (Signed)
Cardiology Office Note    Date:  07/13/2016   ID:  Traci White, DOB 03/09/1975, MRN [id number]  PCP:  Daisy Floro, MD  Cardiologist: Lesleigh Noe, MD   Chief Complaint  Patient presents with  . Tachycardia    History of Present Illness:  Traci White is a 41 y.o. female referred by Jarrett Soho, NP/PAC for evaluation of tachycardia.  The patient is referred because of instances of tachycardia at rest. Despite the presence of the elevated heart rate she is without symptoms. She denies dyspnea, palpitations, syncope, edema, chest pain, and orthopnea. She was encouraged by colleagues to be evaluated because there have been instances where she attempted to donate blood but could not because her heart rate would remain greater than 120 bpm.  Her only significant medical problem is a relatively recent diagnosis of Sjogren's.  Past Medical History:  Diagnosis Date  . GERD (gastroesophageal reflux disease)     Past Surgical History:  Procedure Laterality Date  . BUNIONECTOMY Right 11/2015  . CESAREAN SECTION  05/14/2005  . CESAREAN SECTION  05/2009  . WISDOM TOOTH EXTRACTION  1999    Current Medications: Outpatient Medications Prior to Visit  Medication Sig Dispense Refill  . cetirizine (ZYRTEC) 10 MG tablet Take 10 mg by mouth daily.     Marland Kitchen doxycycline (VIBRAMYCIN) 50 MG capsule Take 50 mg by mouth at bedtime.   99  . hydroxychloroquine (PLAQUENIL) 200 MG tablet Take 200 mg by mouth 2 (two) times daily.    . norethindrone-ethinyl estradiol (JUNEL FE,GILDESS FE,LOESTRIN FE) 1-20 MG-MCG tablet Take 1 tablet by mouth daily.     . Omega-3 Fatty Acids (FISH OIL PO) Take 4 capsules by mouth daily. For eyes    . pantoprazole (PROTONIX) 40 MG tablet Take 40 mg by mouth daily.     . cycloSPORINE (RESTASIS) 0.05 % ophthalmic emulsion 1 drop 2 times daily.    Marland Kitchen PAZEO 0.7 % SOLN   6   No facility-administered medications prior to visit.      Allergies:   Patient has  no known allergies.   Social History   Social History  . Marital status: Married    Spouse name: N/A  . Number of children: N/A  . Years of education: N/A   Social History Main Topics  . Smoking status: Never Smoker  . Smokeless tobacco: Never Used  . Alcohol use No  . Drug use: No  . Sexual activity: Not Asked   Other Topics Concern  . None   Social History Narrative  . None     Family History:  The patient's family history includes Diabetes Mellitus II in her mother; Healthy in her brother; Heart attack in her mother; Heart disease in her mother; High Cholesterol in her father; High blood pressure in her father; Other in her father.   ROS:   Please see the history of present illness.    Recent diagnosis of Sjogren's. Concern that she at times has elevated heart rate and doesn't know why. She is unaware that the heart rate is elevated. She has intermittent headaches. She has had no systemic inflammatory problems related to Sjogren's. 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 All other systems reviewed and are negative.   PHYSICAL EXAM:   VS:  BP 104/70 (BP Location: Left Arm)   Pulse (!) 101   Ht 5' 3.5" (1.613 m)   Wt 164 lb 9.6  oz (74.7 kg)   BMI 28.70 kg/m    GEN: Well nourished, well developed, in no acute distress  HEENT: normal  Neck: no JVD, carotid bruits, or masses Cardiac: RRR; no murmurs, rubs, or gallops,no edema  Respiratory:  clear to auscultation bilaterally, normal work of breathing GI: soft, nontender, nondistended, + BS MS: no deformity or atrophy  Skin: warm and dry, no rash Neuro:  Alert and Oriented x 3, Strength and sensation are intact Psych: euthymic mood, full affect  Wt Readings from Last 3 Encounters:  07/13/16 164 lb 9.6 oz (74.7 kg)  11/16/15 164 lb 9.6 oz (74.7 kg)  09/30/15 165 lb (74.8 kg)      Studies/Labs Reviewed:   EKG:  EKG  Sinus tachycardia 101 bpm. Otherwise  unremarkable.  Recent Labs: No results found for requested labs within last 8760 hours.   Lipid Panel No results found for: CHOL, TRIG, HDL, CHOLHDL, VLDL, LDLCALC, LDLDIRECT  Additional studies/ records that were reviewed today include:  No data    ASSESSMENT:    1. Sinus tachycardia   2. Tachycardia      PLAN:  In order of problems listed above:  1. TSH, CBC, echocardiogram, and 48 hour Holter monitorTo assess the complaint of inappropriate sinus tachycardia. Heart rate is only mildly elevated today. She is totally asymptomatic.  She is asymptomatic. The testing will help us exclude physiologic perturbations second driveup the heart rate. Thyroid disease and anemia will be excluded. An echocardiogram will be done to exclude pericardial disease and cardiomyopathy. A 48 hour Holter monitor will be done to rule out pathologic arrhythmia.  Medication Adjustments/Labs and Tests Ordered: Current medicines are reviewed at length with the patient today.  Concerns regarding medicines are outlined above.  Medication changes, Labs and Tests ordered today are listed in the Patient Instructions below. Patient Instructions  Medication Instructions:  None  Labwork: None  Testing/Procedures: Your physician has requested that you have an echocardiogram. Echocardiography is a painless test that uses sound waves to create images of your heart. It provides your doctor with information about the size and shape of your heart and how well your heart's chambers and valves are working. This procedure takes approximately one hour. There are no restrictions for this procedure.  Your physician has recommended that you wear a 48 hour holter monitor. Holter monitors are medical devices that record the heart's electrical activity. Doctors most often use these monitors to diagnose arrhythmias. Arrhythmias are problems with the speed or rhythm of the heartbeat. The monitor is a small, portable device. You  can wear one while you do your normal daily activities. This is usually used to diagnose what is causing palpitations/syncope (passing out).    Follow-Up: Your physician recommends that you schedule a follow-up appointment as needed with Dr. Katrinka BlazingSmith.    Any Other Special Instructions Will Be Listed Below (If Applicable).     If you need a refill on your cardiac medications before your next appointment, please call your pharmacy.      Signed, Lesleigh NoeHenry W Cady Hafen III, MD  07/13/2016 2:46 PM    [facility] [address], [place], [place]  [address] Phone: [phone]) [phone]; Fax: [phone]

## 2016-07-13 ENCOUNTER — Encounter: Payer: Self-pay | Admitting: Interventional Cardiology

## 2016-07-13 ENCOUNTER — Ambulatory Visit (INDEPENDENT_AMBULATORY_CARE_PROVIDER_SITE_OTHER): Payer: 59 | Admitting: Interventional Cardiology

## 2016-07-13 VITALS — BP 104/70 | HR 101 | Ht 63.5 in | Wt 164.6 lb

## 2016-07-13 DIAGNOSIS — R000 Tachycardia, unspecified: Secondary | ICD-10-CM

## 2016-07-13 MED FILL — NORETHIN-ESTRAD-FERR 1-0.02: 1-20 | 84 days supply | Qty: 84 | Fill #3

## 2016-07-13 NOTE — Patient Instructions (Signed)
Medication Instructions:  None  Labwork: None  Testing/Procedures: Your physician has requested that you have an echocardiogram. Echocardiography is a painless test that uses sound waves to create images of your heart. It provides your doctor with information about the size and shape of your heart and how well your heart's chambers and valves are working. This procedure takes approximately one hour. There are no restrictions for this procedure.  Your physician has recommended that you wear a 48 hour holter monitor. Holter monitors are medical devices that record the heart's electrical activity. Doctors most often use these monitors to diagnose arrhythmias. Arrhythmias are problems with the speed or rhythm of the heartbeat. The monitor is a small, portable device. You can wear one while you do your normal daily activities. This is usually used to diagnose what is causing palpitations/syncope (passing out).    Follow-Up: Your physician recommends that you schedule a follow-up appointment as needed with Dr. Katrinka BlazingSmith.    Any Other Special Instructions Will Be Listed Below (If Applicable).     If you need a refill on your cardiac medications before your next appointment, please call your pharmacy.

## 2016-07-16 MED FILL — PANTOPRAZOLE SOD DR 40 MG T: 40 | 90 days supply | Qty: 90 | Fill #3

## 2016-07-16 MED FILL — DOXYCYCLINE HYC 50 MG CAP: 50 | 90 days supply | Qty: 90 | Fill #3

## 2016-07-23 ENCOUNTER — Encounter: Payer: 59 | Admitting: Podiatry

## 2016-07-26 ENCOUNTER — Ambulatory Visit (INDEPENDENT_AMBULATORY_CARE_PROVIDER_SITE_OTHER): Payer: 59

## 2016-07-26 ENCOUNTER — Ambulatory Visit (INDEPENDENT_AMBULATORY_CARE_PROVIDER_SITE_OTHER): Payer: Self-pay | Admitting: Podiatry

## 2016-07-26 ENCOUNTER — Other Ambulatory Visit: Payer: Self-pay

## 2016-07-26 ENCOUNTER — Ambulatory Visit (HOSPITAL_COMMUNITY): Payer: 59 | Attending: Cardiology

## 2016-07-26 DIAGNOSIS — R000 Tachycardia, unspecified: Secondary | ICD-10-CM

## 2016-07-26 DIAGNOSIS — M2010 Hallux valgus (acquired), unspecified foot: Secondary | ICD-10-CM | POA: Diagnosis not present

## 2016-07-26 DIAGNOSIS — I058 Other rheumatic mitral valve diseases: Secondary | ICD-10-CM | POA: Diagnosis not present

## 2016-07-26 DIAGNOSIS — Z472 Encounter for removal of internal fixation device: Secondary | ICD-10-CM

## 2016-08-02 NOTE — Progress Notes (Signed)
Subjective: Traci White is a 41 y.o. is seen today in office s/p right foot HWR preformed on 07/09/2016 with Dr. Charlsie Merlesegal. They state their pain is much better and overall the foot feels much better. Denies any systemic complaints such as fevers, chills, nausea, vomiting. No calf pain, chest pain, shortness of breath.   Objective: General: No acute distress, AAOx3  DP/PT pulses palpable 2/4, CRT < 3 sec to all digits.  Protective sensation intact. Motor function intact.  Right foot: Incision is well coapted without any evidence of dehiscence and sutures are intact. There is no surrounding erythema, ascending cellulitis, fluctuance, crepitus, malodor, drainage/purulence. There is trace edema around the surgical site. There is no pain along the surgical site. Over the area feels much better than it did prior to surgery. No other areas of tenderness to bilateral lower extremities.  No other open lesions or pre-ulcerative lesions.  No pain with calf compression, swelling, warmth, erythema.   Assessment and Plan:  Status post right foot HWR, doing well with no complications   -Treatment options discussed including all alternatives, risks, and complications -Sutures removed today without complications. After removal incision remained well coapted. Antibiotic ointment and a bandage was applied. She can continue with antibiotic ointment now overtime can use cocoa butter/IV cream to help with scarring. -Continue regular shoe gear as tolerated. -Ice/elevation -Pain medication as needed. -Monitor for any clinical signs or symptoms of infection and DVT/PE and directed to call the office immediately should any occur or go to the ER. -Follow-up in 4 weeks if needed or sooner if any problems arise. In the meantime, encouraged to call the office with any questions, concerns, change in symptoms.   Ovid CurdMatthew Wagoner, DPM

## 2016-08-07 ENCOUNTER — Telehealth: Payer: Self-pay | Admitting: Interventional Cardiology

## 2016-08-07 NOTE — Telephone Encounter (Signed)
Patient calling, states that she turned in monitor last week and would like an update on the results.

## 2016-08-08 NOTE — Telephone Encounter (Signed)
Patient made aware that her monitor still needs to be reviewed by Dr. Katrinka BlazingSmith. Patient made aware that he is out of the office until next week and that as soon as he reviews it we will call her with results. Patient verbalized understanding and thanked me for the call.

## 2016-08-23 ENCOUNTER — Ambulatory Visit: Payer: 59 | Admitting: Podiatry

## 2016-09-07 MED FILL — HYDROXYCHLOROQUINE 200 MG T: 200 | 30 days supply | Qty: 60 | Fill #0

## 2016-09-27 DIAGNOSIS — Z01419 Encounter for gynecological examination (general) (routine) without abnormal findings: Secondary | ICD-10-CM | POA: Diagnosis not present

## 2016-09-27 DIAGNOSIS — Z6828 Body mass index (BMI) 28.0-28.9, adult: Secondary | ICD-10-CM | POA: Diagnosis not present

## 2016-10-04 DIAGNOSIS — Z6828 Body mass index (BMI) 28.0-28.9, adult: Secondary | ICD-10-CM | POA: Diagnosis not present

## 2016-10-04 DIAGNOSIS — E663 Overweight: Secondary | ICD-10-CM | POA: Diagnosis not present

## 2016-10-04 DIAGNOSIS — R5383 Other fatigue: Secondary | ICD-10-CM | POA: Diagnosis not present

## 2016-10-04 DIAGNOSIS — Z79899 Other long term (current) drug therapy: Secondary | ICD-10-CM | POA: Diagnosis not present

## 2016-10-04 DIAGNOSIS — K111 Hypertrophy of salivary gland: Secondary | ICD-10-CM | POA: Diagnosis not present

## 2016-10-04 DIAGNOSIS — M3500 Sicca syndrome, unspecified: Secondary | ICD-10-CM | POA: Diagnosis not present

## 2016-10-19 MED FILL — NORETHIN-ESTRAD-FERR 1-0.02: 1-20 | 84 days supply | Qty: 84 | Fill #0

## 2016-10-19 MED FILL — PANTOPRAZOLE SOD DR 40 MG T: 40 | 90 days supply | Qty: 90 | Fill #0

## 2016-10-19 MED FILL — HYDROXYCHLOROQUINE 200 MG T: 200 | 30 days supply | Qty: 60 | Fill #0

## 2016-10-24 DIAGNOSIS — Z79899 Other long term (current) drug therapy: Secondary | ICD-10-CM | POA: Diagnosis not present

## 2016-10-25 DIAGNOSIS — H01025 Squamous blepharitis left lower eyelid: Secondary | ICD-10-CM | POA: Diagnosis not present

## 2016-10-25 DIAGNOSIS — H01022 Squamous blepharitis right lower eyelid: Secondary | ICD-10-CM | POA: Diagnosis not present

## 2016-10-25 DIAGNOSIS — M3501 Sicca syndrome with keratoconjunctivitis: Secondary | ICD-10-CM | POA: Diagnosis not present

## 2016-11-15 MED FILL — RESTASIS 0.05% EYE EMULSION: 0.05 | 90 days supply | Qty: 180 | Fill #0

## 2016-11-19 MED FILL — DOXYCYCLINE MONOHYDRATE 50: 50 | 90 days supply | Qty: 90 | Fill #0

## 2016-12-13 MED FILL — HYDROXYCHLOROQUINE 200 MG: 200 | 90 days supply | Qty: 180 | Fill #1

## 2017-01-09 MED FILL — LARIN FE 1-20 TABLET: 1-20 | 84 days supply | Qty: 84 | Fill #1

## 2017-01-09 MED FILL — PANTOPRAZOLE SOD DR 40 MG T: 40 | 90 days supply | Qty: 90 | Fill #1

## 2017-02-15 MED FILL — RESTASIS 0.05% EYE EMULSION: 0.05 | 90 days supply | Qty: 180 | Fill #1

## 2017-02-26 DIAGNOSIS — H1031 Unspecified acute conjunctivitis, right eye: Secondary | ICD-10-CM | POA: Diagnosis not present

## 2017-02-26 DIAGNOSIS — J0100 Acute maxillary sinusitis, unspecified: Secondary | ICD-10-CM | POA: Diagnosis not present

## 2017-02-26 MED FILL — CHERATUSSIN AC SYRUP: 100-10 | 4 days supply | Qty: 120 | Fill #0

## 2017-02-26 MED FILL — POLYMYXIN B/TMP EYE DROPS: 10000-0.1 | 25 days supply | Qty: 10 | Fill #0

## 2017-02-26 MED FILL — AMOX-CLAV 500-125 MG TABLET: 500-125 | 10 days supply | Qty: 20 | Fill #0

## 2017-03-13 DIAGNOSIS — H16223 Keratoconjunctivitis sicca, not specified as Sjogren's, bilateral: Secondary | ICD-10-CM | POA: Diagnosis not present

## 2017-03-13 DIAGNOSIS — H04123 Dry eye syndrome of bilateral lacrimal glands: Secondary | ICD-10-CM | POA: Diagnosis not present

## 2017-03-13 DIAGNOSIS — Z79899 Other long term (current) drug therapy: Secondary | ICD-10-CM | POA: Diagnosis not present

## 2017-03-27 MED FILL — LARIN FE 1-20 TABLET: 1-20 | 84 days supply | Qty: 84 | Fill #2

## 2017-03-27 MED FILL — PANTOPRAZOLE SOD DR 40 MG T: 40 | 90 days supply | Qty: 90 | Fill #2

## 2017-03-27 MED FILL — XIIDRA 5% EYE DROPS: 5 | 90 days supply | Qty: 180 | Fill #0

## 2017-04-03 DIAGNOSIS — M25521 Pain in right elbow: Secondary | ICD-10-CM | POA: Diagnosis not present

## 2017-04-03 DIAGNOSIS — M3500 Sicca syndrome, unspecified: Secondary | ICD-10-CM | POA: Diagnosis not present

## 2017-04-03 DIAGNOSIS — M25522 Pain in left elbow: Secondary | ICD-10-CM | POA: Diagnosis not present

## 2017-04-03 DIAGNOSIS — K111 Hypertrophy of salivary gland: Secondary | ICD-10-CM | POA: Diagnosis not present

## 2017-04-03 DIAGNOSIS — Z6828 Body mass index (BMI) 28.0-28.9, adult: Secondary | ICD-10-CM | POA: Diagnosis not present

## 2017-04-03 DIAGNOSIS — E663 Overweight: Secondary | ICD-10-CM | POA: Diagnosis not present

## 2017-05-09 ENCOUNTER — Ambulatory Visit (INDEPENDENT_AMBULATORY_CARE_PROVIDER_SITE_OTHER): Payer: Self-pay | Admitting: Family Medicine

## 2017-05-09 VITALS — BP 105/75 | HR 115 | Temp 98.3°F | Resp 16 | Wt 166.2 lb

## 2017-05-09 DIAGNOSIS — R519 Headache, unspecified: Secondary | ICD-10-CM

## 2017-05-09 DIAGNOSIS — R0981 Nasal congestion: Secondary | ICD-10-CM

## 2017-05-09 DIAGNOSIS — R51 Headache: Secondary | ICD-10-CM

## 2017-05-09 MED ORDER — IPRATROPIUM BROMIDE 0.06 % NA SOLN: 1.0000 | Freq: Three times a day (TID) | NASAL | 0 refills | Status: DC

## 2017-05-09 MED FILL — IPRATROPIUM 0.06% SPRAY: 0.06 | 28 days supply | Qty: 15 | Fill #0

## 2017-05-09 NOTE — Patient Instructions (Signed)

## 2017-05-09 NOTE — Progress Notes (Signed)
Traci White is a 42 y.o. female who present with 5 days of various symptoms to include nasal congestion, headache, fatigue, body aches and chills. She denies any known sick contacts and has attempted to treat her symptoms by resting and taking Excedrin migraine- of note she reports being a week and a half past her expected menstrual cycle and this has become more of a regular occurrence of being irregular.   Review of Systems  Constitutional: Negative for chills, fever and malaise/fatigue.  HENT: Negative for congestion, ear discharge, ear pain, sinus pain and sore throat.   Eyes: Negative.   Respiratory: Negative for cough, sputum production and shortness of breath.   Cardiovascular: Negative.  Negative for chest pain.  Gastrointestinal: Negative for abdominal pain, diarrhea, nausea and vomiting.  Genitourinary: Negative for dysuria, frequency, hematuria and urgency.  Musculoskeletal: Negative for myalgias.  Skin: Negative.   Neurological: Negative for headaches.  Endo/Heme/Allergies: Negative.   Psychiatric/Behavioral: Negative.     O: Vitals:   05/09/17 1421  BP: 105/75  Pulse: (!) 115  Resp: 16  Temp: 98.3 F (36.8 C)  SpO2: 98%   Physical Exam  Constitutional: She is oriented to person, place, and time. Vital signs are normal. She appears well-developed and well-nourished. She is active.  Non-toxic appearance. She does not have a sickly appearance.  HENT:  Head: Normocephalic.  Right Ear: Hearing, tympanic membrane, external ear and ear canal normal.  Left Ear: Hearing, tympanic membrane, external ear and ear canal normal.  Nose: Rhinorrhea present. Right sinus exhibits frontal sinus tenderness. Left sinus exhibits frontal sinus tenderness.  Mouth/Throat: Uvula is midline and oropharynx is clear and moist.  Neck: Normal range of motion. Neck supple.  Cardiovascular: Normal rate, regular rhythm, normal heart sounds and normal pulses.  Pulmonary/Chest: Effort normal and  breath sounds normal.  Abdominal: Soft. Bowel sounds are normal.  Musculoskeletal: Normal range of motion.  Lymphadenopathy:       Head (right side): No submental and no submandibular adenopathy present.       Head (left side): No submental and no submandibular adenopathy present.    She has no cervical adenopathy.  Neurological: She is alert and oriented to person, place, and time.  Psychiatric: She has a normal mood and affect.  Vitals reviewed.   A: 1. Nasal congestion   2. Acute nonintractable headache, unspecified headache type    P: Trial medication for nasal congestion and continue HA treatment staff will call to assess symptoms in 2 days.  Exam findings, diagnosis etiology and medication use and indications reviewed with patient. Follow- Up and discharge instructions provided. No emergent/urgent issues found on exam.  Patient verbalized understanding of information provided and agrees with plan of care (POC), all questions answered.   1. Nasal congestion - ipratropium (ATROVENT) 0.06 % nasal spray; Place 1 spray into both nostrils 3 (three) times daily. 2. Acute nonintractable headache, unspecified headache type Continue round the clock medication for HA for at least 48 hours as discussed to breakup headache/migraine syndrome. 3. Tachycardia-  Discussed with patient who is aware of this condition and is under the care and treatment of healthcare team

## 2017-05-10 ENCOUNTER — Telehealth: Payer: Self-pay | Admitting: Emergency Medicine

## 2017-05-10 DIAGNOSIS — R51 Headache: Principal | ICD-10-CM

## 2017-05-10 DIAGNOSIS — W57XXXA Bitten or stung by nonvenomous insect and other nonvenomous arthropods, initial encounter: Secondary | ICD-10-CM

## 2017-05-10 DIAGNOSIS — R519 Headache, unspecified: Secondary | ICD-10-CM

## 2017-05-10 MED ORDER — DOXYCYCLINE HYCLATE 100 MG PO TABS: 200.0000 mg | ORAL_TABLET | Freq: Once | ORAL | 0 refills | Status: AC

## 2017-05-10 MED ORDER — PROMETHAZINE HCL 25 MG PO TABS: 25.0000 mg | ORAL_TABLET | Freq: Three times a day (TID) | ORAL | 0 refills | Status: DC | PRN

## 2017-05-10 NOTE — Telephone Encounter (Addendum)
Lovie Macadamia was contacted as a follow-up due to her previous visit to check on her symptoms.  It was at this time she declined disclosed to the CMA that she had been bitten in the past month by 2 ticks on 2 separate occasions.  She wanted to know since she was seen and treated for headaches with this new information alter her treatment plan.  With this new information and the patient's symptoms prophylactic Lyme disease treatment was initiated and an additional medication for headaches was provided.  Patient reports that tick exposure occurred the family dog.  Patient reports attachment for both ticks occurred to the posterior calves.  Patient reports exposure of attachment was less than 36 hours for each incident.  Patient cannot identify type of tick.  Patient denies rash of EM consistent with Lyme disease joint pain or general malaise and fatigue.  New onset of persistent headache is major symptom.  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  Nausea is so associated symptoms.  Symptoms are relieved with rest and dimly lit room.  Patient has been able to attend work daily.  No acute concerns on exam nor during follow-up conversation.  Patient verbalized understanding and agrees with plan of care.  Additional follow-up call in 48 to 72 hours will be made.  Zachery Dauer, DNP, FNP-C

## 2017-05-15 DIAGNOSIS — K219 Gastro-esophageal reflux disease without esophagitis: Secondary | ICD-10-CM | POA: Diagnosis not present

## 2017-05-15 DIAGNOSIS — M3509 Sicca syndrome with other organ involvement: Secondary | ICD-10-CM | POA: Diagnosis not present

## 2017-05-15 DIAGNOSIS — Z0000 Encounter for general adult medical examination without abnormal findings: Secondary | ICD-10-CM | POA: Diagnosis not present

## 2017-06-19 DIAGNOSIS — H01025 Squamous blepharitis left lower eyelid: Secondary | ICD-10-CM | POA: Diagnosis not present

## 2017-06-19 DIAGNOSIS — M3501 Sicca syndrome with keratoconjunctivitis: Secondary | ICD-10-CM | POA: Diagnosis not present

## 2017-06-19 DIAGNOSIS — H10413 Chronic giant papillary conjunctivitis, bilateral: Secondary | ICD-10-CM | POA: Diagnosis not present

## 2017-06-19 DIAGNOSIS — H01022 Squamous blepharitis right lower eyelid: Secondary | ICD-10-CM | POA: Diagnosis not present

## 2017-06-21 MED FILL — PANTOPRAZOLE SOD DR 40 MG T: 40 | 90 days supply | Qty: 90 | Fill #3

## 2017-06-21 MED FILL — NORETHIN-ESTRAD-FERR 1-0.02: 1-20 | 84 days supply | Qty: 84 | Fill #3

## 2017-06-21 MED FILL — HYDROXYCHLOROQUINE SULFATE: 200 | 30 days supply | Qty: 60 | Fill #2

## 2017-06-25 ENCOUNTER — Ambulatory Visit (INDEPENDENT_AMBULATORY_CARE_PROVIDER_SITE_OTHER): Payer: Self-pay | Admitting: Family Medicine

## 2017-06-25 VITALS — BP 100/75 | HR 95 | Temp 98.1°F | Resp 16 | Wt 164.6 lb

## 2017-06-25 DIAGNOSIS — R05 Cough: Secondary | ICD-10-CM

## 2017-06-25 DIAGNOSIS — R059 Cough, unspecified: Secondary | ICD-10-CM

## 2017-06-25 DIAGNOSIS — J019 Acute sinusitis, unspecified: Secondary | ICD-10-CM

## 2017-06-25 MED ORDER — AMOXICILLIN-POT CLAVULANATE 875-125 MG PO TABS: 1.0000 | ORAL_TABLET | Freq: Two times a day (BID) | ORAL | 0 refills | Status: DC

## 2017-06-25 MED ORDER — MONTELUKAST SODIUM 10 MG PO TABS: 10.0000 mg | ORAL_TABLET | Freq: Every day | ORAL | 0 refills | Status: DC

## 2017-06-25 MED FILL — AMOX-CLAV 875-125 MG TABLET: 875-125 | 10 days supply | Qty: 20 | Fill #0

## 2017-06-25 MED FILL — MONTELUKAST SOD 10 MG TAB: 10 | 14 days supply | Qty: 14 | Fill #0

## 2017-06-25 NOTE — Progress Notes (Signed)
Traci White is a 42 y.o. female who presents today with concerns of cough, headache, and nasal congestion intermittently for the last 3-4 weeks with a prior treatment of similar symptoms about 6 weeks ago treated with a nasal spray.  Review of Systems  Constitutional: Negative for chills, fever and malaise/fatigue.  HENT: Negative for congestion, ear discharge, ear pain, sinus pain and sore throat.   Eyes: Negative.   Respiratory: Negative for cough, sputum production and shortness of breath.   Cardiovascular: Negative.  Negative for chest pain.  Gastrointestinal: Negative for abdominal pain, diarrhea, nausea and vomiting.  Genitourinary: Negative for dysuria, frequency, hematuria and urgency.  Musculoskeletal: Negative for myalgias.  Skin: Negative.   Neurological: Negative for headaches.  Endo/Heme/Allergies: Negative.   Psychiatric/Behavioral: Negative.     O: Vitals:   06/25/17 1524  BP: 100/75  Pulse: 95  Resp: 16  Temp: 98.1 F (36.7 C)  SpO2: 99%     Physical Exam  Constitutional: She is oriented to person, place, and time. Vital signs are normal. She appears well-developed and well-nourished. She is active.  Non-toxic appearance. She does not have a sickly appearance.  HENT:  Head: Normocephalic.  Right Ear: Hearing, tympanic membrane, external ear and ear canal normal.  Left Ear: Hearing, external ear and ear canal normal. Tympanic membrane is injected and erythematous.  Nose: Mucosal edema and rhinorrhea present. Right sinus exhibits frontal sinus tenderness. Left sinus exhibits frontal sinus tenderness.  Mouth/Throat: Uvula is midline and oropharynx is clear and moist.  Neck: Normal range of motion. Neck supple.  Cardiovascular: Normal rate, regular rhythm, normal heart sounds and normal pulses.  Pulmonary/Chest: Effort normal and breath sounds normal.  Abdominal: Soft. Bowel sounds are normal.  Musculoskeletal: Normal range of motion.  Lymphadenopathy:   Head (right side): No submental and no submandibular adenopathy present.       Head (left side): No submental and no submandibular adenopathy present.    She has no cervical adenopathy.  Neurological: She is alert and oriented to person, place, and time.  Psychiatric: She has a normal mood and affect.  Vitals reviewed.  A: 1. Acute non-recurrent sinusitis, unspecified location    P: Will treat for sinusitis due to mild but persistent and bothersome symptoms- patient is advised to seek f/u with PCP if headaches continue without improvement. Exam findings, diagnosis etiology and medication use and indications reviewed with patient. Follow- Up and discharge instructions provided. No emergent/urgent issues found on exam.  Patient verbalized understanding of information provided and agrees with plan of care (POC), all questions answered.  1. Acute non-recurrent sinusitis, unspecified location - amoxicillin-clavulanate (AUGMENTIN) 875-125 MG tablet; Take 1 tablet by mouth 2 (two) times daily.  2. Cough - montelukast (SINGULAIR) 10 MG tablet; Take 1 tablet (10 mg total) by mouth at bedtime.

## 2017-06-25 NOTE — Patient Instructions (Signed)

## 2017-07-08 DIAGNOSIS — N912 Amenorrhea, unspecified: Secondary | ICD-10-CM | POA: Diagnosis not present

## 2017-08-19 MED FILL — XIIDRA 5% EYE DROPS: 5 | 90 days supply | Qty: 180 | Fill #1

## 2017-08-20 MED FILL — HYDROXYCHLOROQUINE SULFATE: 200 | 90 days supply | Qty: 180 | Fill #0

## 2017-09-18 DIAGNOSIS — Z79899 Other long term (current) drug therapy: Secondary | ICD-10-CM | POA: Diagnosis not present

## 2017-09-18 DIAGNOSIS — M3501 Sicca syndrome with keratoconjunctivitis: Secondary | ICD-10-CM | POA: Diagnosis not present

## 2017-09-19 MED FILL — PANTOPRAZOLE SOD DR 40 MG T: 40 | 30 days supply | Qty: 30 | Fill #0

## 2017-09-19 MED FILL — NORETHIN-ESTRAD-FERR 1-0.02: 1-20 | 28 days supply | Qty: 28 | Fill #0

## 2017-09-30 DIAGNOSIS — Z1231 Encounter for screening mammogram for malignant neoplasm of breast: Secondary | ICD-10-CM | POA: Diagnosis not present

## 2017-09-30 DIAGNOSIS — Z01419 Encounter for gynecological examination (general) (routine) without abnormal findings: Secondary | ICD-10-CM | POA: Diagnosis not present

## 2017-09-30 DIAGNOSIS — Z6827 Body mass index (BMI) 27.0-27.9, adult: Secondary | ICD-10-CM | POA: Diagnosis not present

## 2017-09-30 DIAGNOSIS — Z1212 Encounter for screening for malignant neoplasm of rectum: Secondary | ICD-10-CM | POA: Diagnosis not present

## 2017-10-01 ENCOUNTER — Other Ambulatory Visit: Payer: Self-pay | Admitting: Obstetrics and Gynecology

## 2017-10-01 DIAGNOSIS — R928 Other abnormal and inconclusive findings on diagnostic imaging of breast: Secondary | ICD-10-CM

## 2017-10-07 ENCOUNTER — Ambulatory Visit: Admission: RE | Admit: 2017-10-07 | Payer: 59 | Source: Ambulatory Visit

## 2017-10-07 ENCOUNTER — Ambulatory Visit
Admission: RE | Admit: 2017-10-07 | Discharge: 2017-10-07 | Disposition: A | Discharge status: Home / Self Care | Payer: 59 | Source: Ambulatory Visit | Attending: Obstetrics and Gynecology | Admitting: Obstetrics and Gynecology

## 2017-10-07 DIAGNOSIS — R928 Other abnormal and inconclusive findings on diagnostic imaging of breast: Secondary | ICD-10-CM

## 2017-10-14 DIAGNOSIS — R5383 Other fatigue: Secondary | ICD-10-CM | POA: Diagnosis not present

## 2017-10-14 DIAGNOSIS — Z6828 Body mass index (BMI) 28.0-28.9, adult: Secondary | ICD-10-CM | POA: Diagnosis not present

## 2017-10-14 DIAGNOSIS — Z79899 Other long term (current) drug therapy: Secondary | ICD-10-CM | POA: Diagnosis not present

## 2017-10-14 DIAGNOSIS — E663 Overweight: Secondary | ICD-10-CM | POA: Diagnosis not present

## 2017-10-14 DIAGNOSIS — K111 Hypertrophy of salivary gland: Secondary | ICD-10-CM | POA: Diagnosis not present

## 2017-10-14 DIAGNOSIS — M3500 Sicca syndrome, unspecified: Secondary | ICD-10-CM | POA: Diagnosis not present

## 2017-10-16 MED FILL — PANTOPRAZOLE SOD DR 40 MG T: 40 | 90 days supply | Qty: 90 | Fill #0

## 2017-10-16 MED FILL — NORETHIN-ESTRAD-FERR 1-0.02: 1-20 | 84 days supply | Qty: 84 | Fill #0

## 2017-11-03 ENCOUNTER — Ambulatory Visit (INDEPENDENT_AMBULATORY_CARE_PROVIDER_SITE_OTHER): Payer: Self-pay | Admitting: Family Medicine

## 2017-11-03 VITALS — BP 110/80 | HR 89 | Temp 99.0°F | Wt 160.8 lb

## 2017-11-03 DIAGNOSIS — R05 Cough: Secondary | ICD-10-CM

## 2017-11-03 DIAGNOSIS — R6889 Other general symptoms and signs: Secondary | ICD-10-CM

## 2017-11-03 DIAGNOSIS — R059 Cough, unspecified: Secondary | ICD-10-CM

## 2017-11-03 LAB — POCT INFLUENZA A/B
INFLUENZA B, POC: NEGATIVE
Influenza A, POC: NEGATIVE

## 2017-11-03 MED ORDER — IPRATROPIUM BROMIDE 0.03 % NA SOLN: 2.0000 | Freq: Two times a day (BID) | NASAL | 0 refills | Status: DC

## 2017-11-03 MED ORDER — BENZONATATE 100 MG PO CAPS: 100.0000 mg | ORAL_CAPSULE | Freq: Three times a day (TID) | ORAL | 0 refills | Status: DC | PRN

## 2017-11-03 MED ORDER — OSELTAMIVIR PHOSPHATE 75 MG PO CAPS: 75.0000 mg | ORAL_CAPSULE | Freq: Two times a day (BID) | ORAL | 0 refills | Status: DC

## 2017-11-03 NOTE — Addendum Note (Signed)
Addended by: Arnette Felts on: 11/03/2017 11:10 AM   Modules accepted: Kipp Brood

## 2017-11-03 NOTE — Patient Instructions (Signed)
You are being treated today for suspected influenza. Start medication today. Rest and hydrate. Follow-up if symptoms worsen or do not improve.   Influenza, Adult Influenza ("the flu") is an infection in the lungs, nose, and throat (respiratory tract). It is caused by a virus. The flu causes many common cold symptoms, as well as a high fever and body aches. It can make you feel very sick. The flu spreads easily from person to person (is contagious). Getting a flu shot (influenza vaccination) every year is the best way to prevent the flu. Follow these instructions at home:  Take over-the-counter and prescription medicines only as told by your doctor.  Use a cool mist humidifier to add moisture (humidity) to the air in your home. This can make it easier to breathe.  Rest as needed.  Drink enough fluid to keep your pee (urine) clear or pale yellow.  Cover your mouth and nose when you cough or sneeze.  Wash your hands with soap and water often, especially after you cough or sneeze. If you cannot use soap and water, use hand sanitizer.  Stay home from work or school as told by your doctor. Unless you are visiting your doctor, try to avoid leaving home until your fever has been gone for 24 hours without the use of medicine.  Keep all follow-up visits as told by your doctor. This is important. How is this prevented?  Getting a yearly (annual) flu shot is the best way to avoid getting the flu. You may get the flu shot in late summer, fall, or winter. Ask your doctor when you should get your flu shot.  Wash your hands often or use hand sanitizer often.  Avoid contact with people who are sick during cold and flu season.  Eat healthy foods.  Drink plenty of fluids.  Get enough sleep.  Exercise regularly. Contact a doctor if:  You get new symptoms.  You have: ? Chest pain. ? Watery poop (diarrhea). ? A fever.  Your cough gets worse.  You start to have more mucus.  You feel sick  to your stomach (nauseous).  You throw up (vomit). Get help right away if:  You start to be short of breath or have trouble breathing.  Your skin or nails turn a bluish color.  You have very bad pain or stiffness in your neck.  You get a sudden headache.  You get sudden pain in your face or ear.  You cannot stop throwing up. This information is not intended to replace advice given to you by your health care provider. Make sure you discuss any questions you have with your health care provider. Document Released: 10/04/2007 Document Revised: 06/02/2015 Document Reviewed: 10/19/2014 Elsevier Interactive Patient Education  2017 [employer].

## 2017-11-03 NOTE — Progress Notes (Signed)
Patient ID: Traci White, female    DOB: 24-Oct-1975, 42 y.o.   MRN: [id number]  PCP: Daisy Floro, MD  Chief Complaint  Patient presents with  . Sore Throat    chills, cough, congestion x 2 days    Subjective:  HPI  Traci White is a 42 y.o. female presents for evaluation of chills, fever, congestion, and cough x 2 days. Recent sick contacts with both children sick with fever and similar symptoms for over 1 week. Children were not diagnosed with flu.  She complains of sore throat, headache, fever T-max 101.2, persistent nonproductive cough, fatigue, and congestion.  History of allergies.  No history of asthma.  Denies any shortness of breath, wheezing, chest tightness.  She has taken Tylenol for fever and Mucinex without relief of symptoms. She is a non-smoker.   Social History   Socioeconomic History  . Marital status: Married    Spouse name: Not on file  . Number of children: Not on file  . Years of education: Not on file  . Highest education level: Not on file  Occupational History  . Not on file  Social Needs  . Financial resource strain: Not on file  . Food insecurity:    Worry: Not on file    Inability: Not on file  . Transportation needs:    Medical: Not on file    Non-medical: Not on file  Tobacco Use  . Smoking status: Never Smoker  . Smokeless tobacco: Never Used  Substance and Sexual Activity  . Alcohol use: No  . Drug use: No  . Sexual activity: Not on file  Lifestyle  . Physical activity:    Days per week: Not on file    Minutes per session: Not on file  . Stress: Not on file  Relationships  . Social connections:    Talks on phone: Not on file    Gets together: Not on file    Attends religious service: Not on file    Active member of club or organization: Not on file    Attends meetings of clubs or organizations: Not on file    Relationship status: Not on file  . Intimate partner violence:    Fear of current or ex partner: Not on file    Emotionally abused: Not on file    Physically abused: Not on file    Forced sexual activity: Not on file  Other Topics Concern  . Not on file  Social History Narrative  . Not on file    Family History  Problem Relation Age of Onset  . Diabetes Mellitus II Mother   . Heart disease Mother   . Heart attack Mother   . High Cholesterol Father   . High blood pressure Father   . Other Father        esophageal reflux  . Healthy Brother      Review of Systems  Pertinent negatives listed in HPI There are no active problems to display for this patient.   No Known Allergies  Prior to Admission medications   Medication Sig Start Date End Date Taking? Authorizing Provider  cetirizine (ZYRTEC) 10 MG tablet Take 10 mg by mouth daily.    Yes [provider]  hydroxychloroquine (PLAQUENIL) 200 MG tablet Take 200 mg by mouth 2 (two) times daily.   Yes [provider]  norethindrone-ethinyl estradiol (JUNEL FE,GILDESS FE,LOESTRIN FE) 1-20 MG-MCG tablet Take 1 tablet by mouth daily.    Yes [provider]  Omega-3 Fatty Acids (FISH OIL PO) Take 4 capsules by mouth daily. For eyes   Yes [provider]  pantoprazole (PROTONIX) 40 MG tablet Take 40 mg by mouth daily.    Yes [provider]  XIIDRA 5 % SOLN  03/27/17  Yes [provider]  amoxicillin-clavulanate (AUGMENTIN) 875-125 MG tablet Take 1 tablet by mouth 2 (two) times daily. Patient not taking: Reported on 11/03/2017 06/25/17   Zachery Dauer, NP  cycloSPORINE (RESTASIS) 0.05 % ophthalmic emulsion Place 1 drop into both eyes 2 (two) times daily.    [provider]  doxycycline (VIBRAMYCIN) 50 MG capsule Take 50 mg by mouth at bedtime.  06/27/15   [provider]  ipratropium (ATROVENT) 0.06 % nasal spray Place 1 spray into both nostrils 3 (three) times daily. Patient not taking: Reported on 06/25/2017 05/09/17   Zachery Dauer, NP  montelukast (SINGULAIR) 10 MG tablet Take  1 tablet (10 mg total) by mouth at bedtime. Patient not taking: Reported on 11/03/2017 06/25/17   Zachery Dauer, NP  promethazine (PHENERGAN) 25 MG tablet Take 1 tablet (25 mg total) by mouth every 8 (eight) hours as needed (headache). Patient not taking: Reported on 06/25/2017 05/10/17   Zachery Dauer, NP    Past Medical, Surgical Family and Social History reviewed and updated.    Objective:   Today's Vitals   11/03/17 1031  BP: 110/80  Pulse: 89  Temp: 99 F (37.2 C)  SpO2: 98%  Weight: 160 lb 12.8 oz (72.9 kg)    Wt Readings from Last 3 Encounters:  11/03/17 160 lb 12.8 oz (72.9 kg)  06/25/17 164 lb 9.6 oz (74.7 kg)  05/09/17 166 lb 3.2 oz (75.4 kg)     Physical Exam  She appears sickly, vital signs noted a low grade fever. Other vitals WNL. Ears- effusions noted bilaterally.  Throat and pharynx erythematous.  Neck supple, negative for adenopathy in the neck. Nose is congested. Sinuses non tender. The chest is clear, without wheezes or rales. Breath sound and respiratory rate normal. Heart rate and rhythm normal.   Assessment & Plan:  1. Flu-like symptoms, 2. Cough Treating symptomatically and will prescribe Tami-Flu as patient within the 72 hours window for medication to be most effective. Rapid flu negative. Given fever and symptoms, strongly suspect possible influenza as the underlying cause of symptoms. - POCT Influenza A/B-negative  Meds ordered this encounter  Medications  . oseltamivir (TAMIFLU) 75 MG capsule    Sig: Take 1 capsule (75 mg total) by mouth 2 (two) times daily.    Dispense:  10 capsule    Refill:  0  . benzonatate (TESSALON) 100 MG capsule    Sig: Take 1-2 capsules (100-200 mg total) by mouth 3 (three) times daily as needed for cough.    Dispense:  40 capsule    Refill:  0  . ipratropium (ATROVENT) 0.03 % nasal spray    Sig: Place 2 sprays into both nostrils 2 (two) times daily.    Dispense:  30 mL    Refill:  0     If symptoms worsen or do  not improve, return for follow-up, follow-up with PCP, or at the emergency department if severity of symptoms warrant a higher level of care.    Godfrey Pick. Tiburcio Pea, MSN, FNP-C [id number]  [place], [place] [address] [phone]

## 2017-11-20 DIAGNOSIS — H1045 Other chronic allergic conjunctivitis: Secondary | ICD-10-CM | POA: Diagnosis not present

## 2017-11-20 DIAGNOSIS — J309 Allergic rhinitis, unspecified: Secondary | ICD-10-CM | POA: Diagnosis not present

## 2017-11-20 DIAGNOSIS — J300 Vasomotor rhinitis: Secondary | ICD-10-CM | POA: Diagnosis not present

## 2017-11-20 MED FILL — FLUTICASONE PROP 50 MCG SPR: 50 | 30 days supply | Qty: 16 | Fill #0

## 2017-11-20 MED FILL — XIIDRA 5% EYE DROPS: 5 | 90 days supply | Qty: 180 | Fill #2

## 2017-12-25 MED FILL — BLISOVI FE 1/20 1-20 MG-MCG: 1-20 | 84 days supply | Qty: 84 | Fill #1

## 2017-12-25 MED FILL — HYDROXYCHLOROQUINE SULFATE: 200 | 30 days supply | Qty: 60 | Fill #0

## 2018-01-21 MED FILL — PANTOPRAZOLE SOD DR 40 MG T: 40 | 90 days supply | Qty: 90 | Fill #1

## 2018-02-11 MED FILL — XIIDRA 5% EYE DROPS: 5 | 90 days supply | Qty: 180 | Fill #3

## 2018-03-04 DIAGNOSIS — R05 Cough: Secondary | ICD-10-CM | POA: Diagnosis not present

## 2018-03-04 DIAGNOSIS — J101 Influenza due to other identified influenza virus with other respiratory manifestations: Secondary | ICD-10-CM | POA: Diagnosis not present

## 2018-03-27 MED FILL — BLISOVI FE 1/20 1-20 MG-MCG: 1-20 | 84 days supply | Qty: 84 | Fill #2

## 2018-03-27 MED FILL — HYDROXYCHLOROQUINE SULFATE: 200 | 30 days supply | Qty: 60 | Fill #1 | Status: TO

## 2018-04-15 DIAGNOSIS — Z79899 Other long term (current) drug therapy: Secondary | ICD-10-CM | POA: Diagnosis not present

## 2018-04-15 DIAGNOSIS — K111 Hypertrophy of salivary gland: Secondary | ICD-10-CM | POA: Diagnosis not present

## 2018-04-15 DIAGNOSIS — M3500 Sicca syndrome, unspecified: Secondary | ICD-10-CM | POA: Diagnosis not present

## 2018-05-23 DIAGNOSIS — J300 Vasomotor rhinitis: Secondary | ICD-10-CM | POA: Diagnosis not present

## 2018-05-27 MED FILL — HYDROXYCHLOROQUINE 200 MG: 200 | 30 days supply | Qty: 60 | Fill #0

## 2018-05-27 MED FILL — XIIDRA 5% EYE DROPS: 5 | 90 days supply | Qty: 180 | Fill #0

## 2018-05-27 MED FILL — PANTOPRAZOLE SOD DR 40 MG T: 40 | 90 days supply | Qty: 90 | Fill #0

## 2018-05-30 DIAGNOSIS — M3500 Sicca syndrome, unspecified: Secondary | ICD-10-CM | POA: Diagnosis not present

## 2018-06-25 MED FILL — BLISOVI FE 1/20 1-20 MG-MCG: 1-20 | 84 days supply | Qty: 84 | Fill #3

## 2018-09-01 MED FILL — HYDROXYCHLOROQUINE SULFATE: 200 | 30 days supply | Qty: 60 | Fill #0

## 2018-09-01 MED FILL — XIIDRA 5% EYE DROPS: 5 | 90 days supply | Qty: 180 | Fill #0

## 2018-09-01 MED FILL — PANTOPRAZOLE SOD DR 40 MG T: 40 | 90 days supply | Qty: 90 | Fill #0

## 2018-09-16 MED FILL — BLISOVI FE 1/20 1-20 MG-MCG: 1-20 | 84 days supply | Qty: 84 | Fill #0

## 2018-09-24 DIAGNOSIS — Z79899 Other long term (current) drug therapy: Secondary | ICD-10-CM | POA: Diagnosis not present

## 2018-09-25 DIAGNOSIS — Z79899 Other long term (current) drug therapy: Secondary | ICD-10-CM | POA: Diagnosis not present

## 2018-09-25 DIAGNOSIS — M3501 Sicca syndrome with keratoconjunctivitis: Secondary | ICD-10-CM | POA: Diagnosis not present

## 2018-09-25 DIAGNOSIS — H01022 Squamous blepharitis right lower eyelid: Secondary | ICD-10-CM | POA: Diagnosis not present

## 2018-09-25 DIAGNOSIS — H01025 Squamous blepharitis left lower eyelid: Secondary | ICD-10-CM | POA: Diagnosis not present

## 2018-10-10 DIAGNOSIS — M3509 Sicca syndrome with other organ involvement: Secondary | ICD-10-CM | POA: Diagnosis not present

## 2018-10-10 DIAGNOSIS — Z0000 Encounter for general adult medical examination without abnormal findings: Secondary | ICD-10-CM | POA: Diagnosis not present

## 2018-10-10 DIAGNOSIS — Z13220 Encounter for screening for lipoid disorders: Secondary | ICD-10-CM | POA: Diagnosis not present

## 2018-10-10 DIAGNOSIS — K219 Gastro-esophageal reflux disease without esophagitis: Secondary | ICD-10-CM | POA: Diagnosis not present

## 2018-10-24 MED FILL — HYDROXYCHLOROQUINE SULFATE: 200 | 30 days supply | Qty: 60 | Fill #1

## 2018-11-12 DIAGNOSIS — M3500 Sicca syndrome, unspecified: Secondary | ICD-10-CM | POA: Diagnosis not present

## 2018-11-12 DIAGNOSIS — Z79899 Other long term (current) drug therapy: Secondary | ICD-10-CM | POA: Diagnosis not present

## 2018-11-12 DIAGNOSIS — K111 Hypertrophy of salivary gland: Secondary | ICD-10-CM | POA: Diagnosis not present

## 2018-11-25 MED FILL — HYDROXYCHLOROQUINE SULFATE: 200 | 30 days supply | Qty: 60 | Fill #0

## 2018-11-25 MED FILL — XIIDRA 5 % SOLN: 5 | 90 days supply | Qty: 180 | Fill #1

## 2018-11-26 MED FILL — PANTOPRAZOLE SOD DR 40 MG T: 40 | 90 days supply | Qty: 90 | Fill #0

## 2018-12-09 MED FILL — BLISOVI FE 1/20 1-20 MG-MCG: 1-20 | 84 days supply | Qty: 84 | Fill #0

## 2019-01-07 MED FILL — HYDROXYCHLOROQUINE SULFATE: 200 | 30 days supply | Qty: 60 | Fill #1

## 2019-02-24 MED FILL — HYDROXYCHLOROQUINE SULFATE: 200 | 30 days supply | Qty: 60 | Fill #2

## 2019-02-24 MED FILL — PANTOPRAZOLE SOD DR 40 MG T: 40 | 90 days supply | Qty: 90 | Fill #0

## 2019-02-24 MED FILL — BLISOVI FE 1/20 1-20 MG-MCG: 1-20 | 84 days supply | Qty: 84 | Fill #0

## 2019-03-16 ENCOUNTER — Other Ambulatory Visit (HOSPITAL_COMMUNITY): Payer: Self-pay | Admitting: Obstetrics and Gynecology

## 2019-03-16 DIAGNOSIS — Z1231 Encounter for screening mammogram for malignant neoplasm of breast: Secondary | ICD-10-CM | POA: Diagnosis not present

## 2019-03-16 DIAGNOSIS — Z01419 Encounter for gynecological examination (general) (routine) without abnormal findings: Secondary | ICD-10-CM | POA: Diagnosis not present

## 2019-03-16 DIAGNOSIS — Z6828 Body mass index (BMI) 28.0-28.9, adult: Secondary | ICD-10-CM | POA: Diagnosis not present

## 2019-03-23 MED FILL — XIIDRA 5 % SOLN: 5 | 90 days supply | Qty: 180 | Fill #2

## 2019-03-24 MED FILL — HYDROXYCHLOROQUINE SULFATE: 200 | 30 days supply | Qty: 60 | Fill #3

## 2019-05-04 MED FILL — HYDROXYCHLOROQUINE SULFATE: 200 | 30 days supply | Qty: 60 | Fill #0

## 2019-05-11 DIAGNOSIS — Z6828 Body mass index (BMI) 28.0-28.9, adult: Secondary | ICD-10-CM | POA: Diagnosis not present

## 2019-05-11 DIAGNOSIS — E663 Overweight: Secondary | ICD-10-CM | POA: Diagnosis not present

## 2019-05-11 DIAGNOSIS — K111 Hypertrophy of salivary gland: Secondary | ICD-10-CM | POA: Diagnosis not present

## 2019-05-11 DIAGNOSIS — M3500 Sicca syndrome, unspecified: Secondary | ICD-10-CM | POA: Diagnosis not present

## 2019-06-22 ENCOUNTER — Other Ambulatory Visit (HOSPITAL_COMMUNITY): Payer: Self-pay | Admitting: Ophthalmology

## 2019-06-22 MED FILL — XIIDRA 5 % SOLN: 5 | 90 days supply | Qty: 180 | Fill #0

## 2019-08-17 MED FILL — HYDROXYCHLOROQUINE SULFATE: 200 | 30 days supply | Qty: 60 | Fill #1

## 2019-08-17 MED FILL — BLISOVI FE 1/20 1-20 MG-MCG: 1-20 | 84 days supply | Qty: 84 | Fill #1

## 2019-08-17 MED FILL — PANTOPRAZOLE SOD DR 40 MG T: 40 | 90 days supply | Qty: 90 | Fill #1

## 2019-09-02 MED FILL — XIIDRA 5 % SOLN: 5 | 90 days supply | Qty: 180 | Fill #1

## 2019-10-01 DIAGNOSIS — H01022 Squamous blepharitis right lower eyelid: Secondary | ICD-10-CM | POA: Diagnosis not present

## 2019-10-01 DIAGNOSIS — Z79899 Other long term (current) drug therapy: Secondary | ICD-10-CM | POA: Diagnosis not present

## 2019-10-01 DIAGNOSIS — H01025 Squamous blepharitis left lower eyelid: Secondary | ICD-10-CM | POA: Diagnosis not present

## 2019-10-01 DIAGNOSIS — M3501 Sicca syndrome with keratoconjunctivitis: Secondary | ICD-10-CM | POA: Diagnosis not present

## 2019-10-01 MED FILL — LOTEMAX SM 0.38 % GEL: 0.38 | 14 days supply | Qty: 5 | Fill #0

## 2019-10-23 DIAGNOSIS — Z0000 Encounter for general adult medical examination without abnormal findings: Secondary | ICD-10-CM | POA: Diagnosis not present

## 2019-10-23 DIAGNOSIS — Z13220 Encounter for screening for lipoid disorders: Secondary | ICD-10-CM | POA: Diagnosis not present

## 2019-10-23 DIAGNOSIS — M3509 Sicca syndrome with other organ involvement: Secondary | ICD-10-CM | POA: Diagnosis not present

## 2019-10-28 ENCOUNTER — Other Ambulatory Visit (HOSPITAL_COMMUNITY): Payer: Self-pay | Admitting: Obstetrics and Gynecology

## 2019-10-28 MED FILL — PROGESTERONE 100 MG CAPSULE: 100 | 12 days supply | Qty: 12 | Fill #0

## 2019-11-09 ENCOUNTER — Other Ambulatory Visit (HOSPITAL_COMMUNITY): Payer: Self-pay | Admitting: Rheumatology

## 2019-11-09 MED FILL — HYDROXYCHLOROQUINE SULFATE: 200 | 30 days supply | Qty: 60 | Fill #0

## 2019-11-09 MED FILL — PANTOPRAZOLE SOD DR 40 MG T: 40 | 90 days supply | Qty: 90 | Fill #2

## 2019-11-09 MED FILL — BLISOVI FE 1/20 1-20 MG-MCG: 1-20 | 84 days supply | Qty: 84 | Fill #2

## 2019-12-29 MED FILL — XIIDRA 5 % SOLN: 5 | 90 days supply | Qty: 180 | Fill #2

## 2020-01-13 DIAGNOSIS — E663 Overweight: Secondary | ICD-10-CM | POA: Diagnosis not present

## 2020-01-13 DIAGNOSIS — Z6829 Body mass index (BMI) 29.0-29.9, adult: Secondary | ICD-10-CM | POA: Diagnosis not present

## 2020-01-13 DIAGNOSIS — M3500 Sicca syndrome, unspecified: Secondary | ICD-10-CM | POA: Diagnosis not present

## 2020-01-13 DIAGNOSIS — K111 Hypertrophy of salivary gland: Secondary | ICD-10-CM | POA: Diagnosis not present

## 2020-02-05 MED FILL — PANTOPRAZOLE SOD DR 40 MG T: 40 | 90 days supply | Qty: 90 | Fill #3

## 2020-02-05 MED FILL — BLISOVI FE 1/20 1-20 MG-MCG: 1-20 | 84 days supply | Qty: 84 | Fill #3

## 2020-02-10 ENCOUNTER — Other Ambulatory Visit (HOSPITAL_COMMUNITY): Payer: Self-pay | Admitting: Rheumatology

## 2020-02-10 MED FILL — HYDROXYCHLOROQUINE SULFATE: 200 | 30 days supply | Qty: 60 | Fill #0

## 2020-02-23 MED FILL — HYDROXYCHLOROQUINE SULFATE: 200 | 30 days supply | Qty: 60 | Fill #0

## 2020-02-29 DIAGNOSIS — H16223 Keratoconjunctivitis sicca, not specified as Sjogren's, bilateral: Secondary | ICD-10-CM | POA: Diagnosis not present

## 2020-02-29 DIAGNOSIS — Z79899 Other long term (current) drug therapy: Secondary | ICD-10-CM | POA: Diagnosis not present

## 2020-02-29 DIAGNOSIS — M3500 Sicca syndrome, unspecified: Secondary | ICD-10-CM | POA: Diagnosis not present

## 2020-02-29 DIAGNOSIS — H0102A Squamous blepharitis right eye, upper and lower eyelids: Secondary | ICD-10-CM | POA: Diagnosis not present

## 2020-04-08 ENCOUNTER — Other Ambulatory Visit (HOSPITAL_COMMUNITY): Payer: Self-pay | Admitting: Family Medicine

## 2020-04-08 DIAGNOSIS — R059 Cough, unspecified: Secondary | ICD-10-CM | POA: Diagnosis not present

## 2020-04-08 MED FILL — XIIDRA 5 % SOLN: 5 | 90 days supply | Qty: 180 | Fill #3

## 2020-04-08 MED FILL — AZITHROMYCIN 250 MG TABLET: 250 | 5 days supply | Qty: 6 | Fill #0

## 2020-04-08 MED FILL — HYDROXYCHLOROQUINE SULFATE: 200 | 30 days supply | Qty: 60 | Fill #1

## 2020-04-19 ENCOUNTER — Other Ambulatory Visit (HOSPITAL_COMMUNITY): Payer: Self-pay

## 2020-04-19 ENCOUNTER — Other Ambulatory Visit (HOSPITAL_COMMUNITY): Payer: Self-pay | Admitting: Obstetrics and Gynecology

## 2020-04-20 ENCOUNTER — Other Ambulatory Visit (HOSPITAL_COMMUNITY): Payer: Self-pay

## 2020-04-21 ENCOUNTER — Other Ambulatory Visit (HOSPITAL_COMMUNITY): Payer: Self-pay

## 2020-04-21 MED ORDER — NORETHIN ACE-ETH ESTRAD-FE 1-20 MG-MCG PO TABS
1.0000 | ORAL_TABLET | Freq: Every day | ORAL | 3 refills | Status: DC
  Filled 2020-04-21: qty 28, 28d supply, fill #0
  Filled 2020-04-21: qty 84, 84d supply, fill #0

## 2020-04-22 ENCOUNTER — Other Ambulatory Visit (HOSPITAL_COMMUNITY): Payer: Self-pay | Admitting: Obstetrics and Gynecology

## 2020-04-22 ENCOUNTER — Other Ambulatory Visit (HOSPITAL_COMMUNITY): Payer: Self-pay

## 2020-04-26 ENCOUNTER — Other Ambulatory Visit (HOSPITAL_COMMUNITY): Payer: Self-pay

## 2020-04-26 MED ORDER — PANTOPRAZOLE SODIUM 40 MG PO TBEC
40.0000 mg | DELAYED_RELEASE_TABLET | Freq: Every day | ORAL | 0 refills | Status: DC
  Filled 2020-04-26: qty 90, 90d supply, fill #0

## 2020-04-27 ENCOUNTER — Other Ambulatory Visit (HOSPITAL_COMMUNITY): Payer: Self-pay

## 2020-04-27 MED ORDER — PANTOPRAZOLE SODIUM 40 MG PO TBEC
40.0000 mg | DELAYED_RELEASE_TABLET | Freq: Every day | ORAL | 3 refills | Status: DC
  Filled 2020-04-27 – 2020-07-14 (×2): qty 90, 90d supply, fill #0
  Filled 2020-11-01: qty 90, 90d supply, fill #1
  Filled 2021-03-01: qty 90, 90d supply, fill #2

## 2020-05-02 ENCOUNTER — Other Ambulatory Visit (HOSPITAL_COMMUNITY): Payer: Self-pay

## 2020-05-23 DIAGNOSIS — K111 Hypertrophy of salivary gland: Secondary | ICD-10-CM | POA: Diagnosis not present

## 2020-05-23 DIAGNOSIS — E663 Overweight: Secondary | ICD-10-CM | POA: Diagnosis not present

## 2020-05-23 DIAGNOSIS — Z6829 Body mass index (BMI) 29.0-29.9, adult: Secondary | ICD-10-CM | POA: Diagnosis not present

## 2020-05-23 DIAGNOSIS — R42 Dizziness and giddiness: Secondary | ICD-10-CM | POA: Diagnosis not present

## 2020-05-23 DIAGNOSIS — Z79899 Other long term (current) drug therapy: Secondary | ICD-10-CM | POA: Diagnosis not present

## 2020-05-23 DIAGNOSIS — M3500 Sicca syndrome, unspecified: Secondary | ICD-10-CM | POA: Diagnosis not present

## 2020-06-05 IMAGING — MG DIGITAL DIAGNOSTIC UNILATERAL LEFT MAMMOGRAM WITH TOMO AND CAD
6 series · 6 of 18 positions shown · non-contrast
Comparison: September 30, 2017

CLINICAL DATA: 42-year-old patient recalled from recent baseline
screening mammogram for evaluation of a possible asymmetry in the
left breast.

EXAM:
DIGITAL DIAGNOSTIC UNILATERAL LEFT MAMMOGRAM WITH CAD AND TOMO

[L MLO synth-2D (1 of 2)]
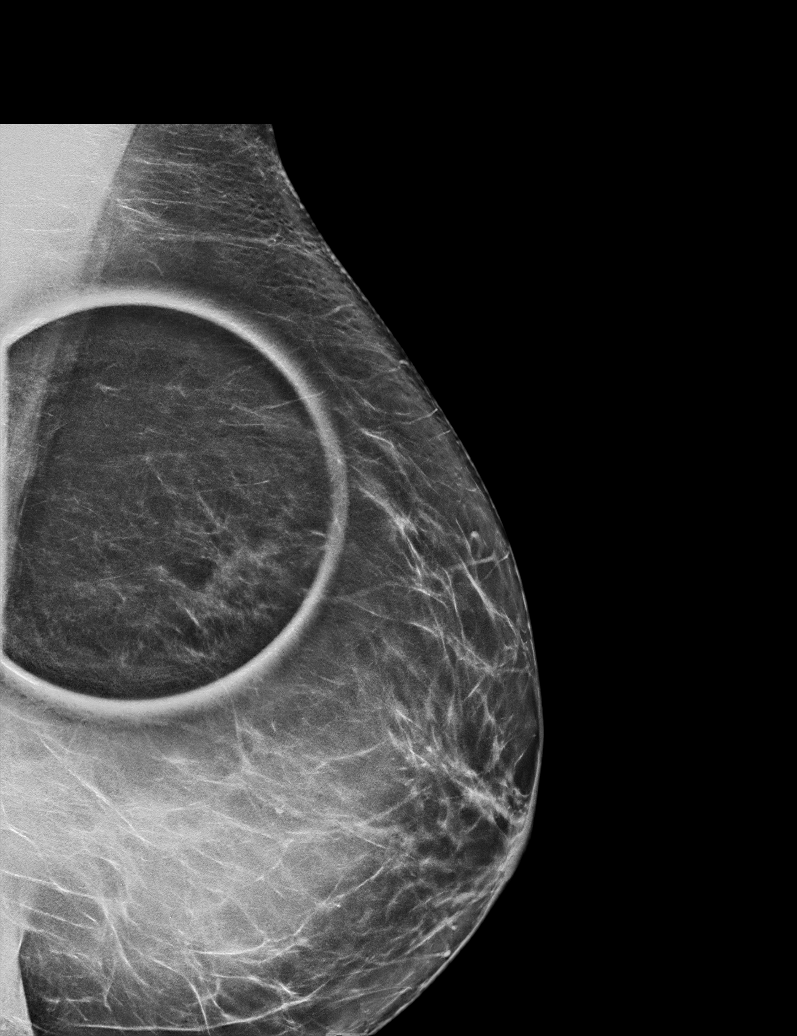

[L MLO synth-2D (2 of 2)]
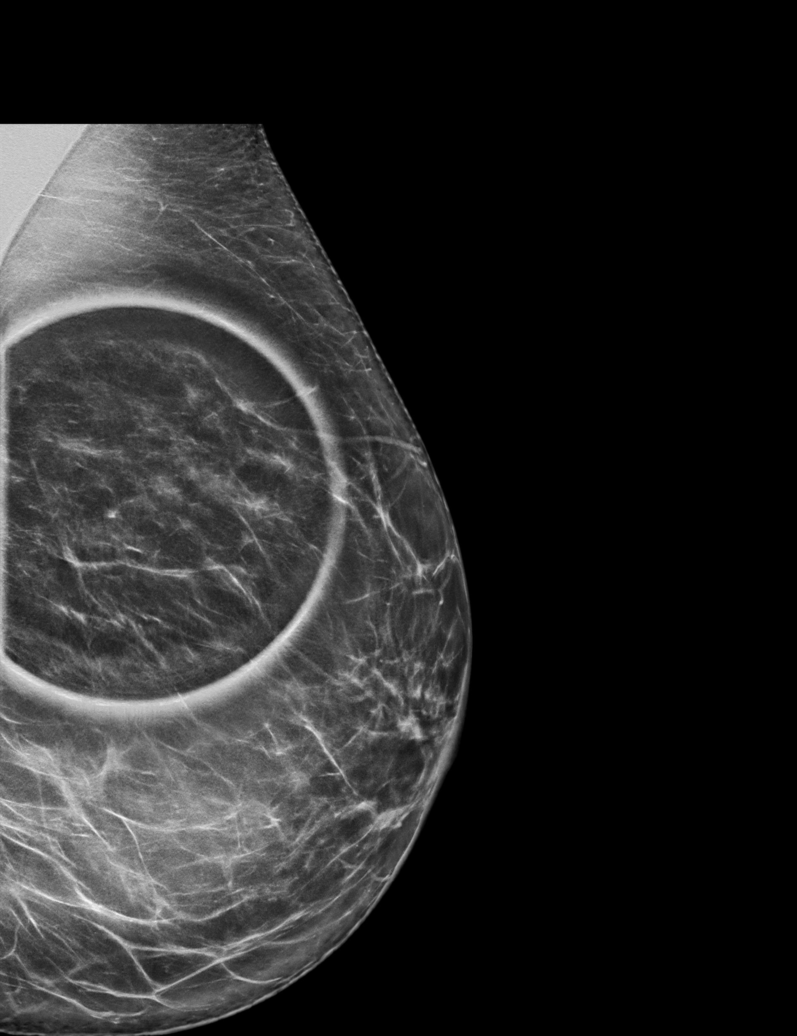

[L CC synth-2D]
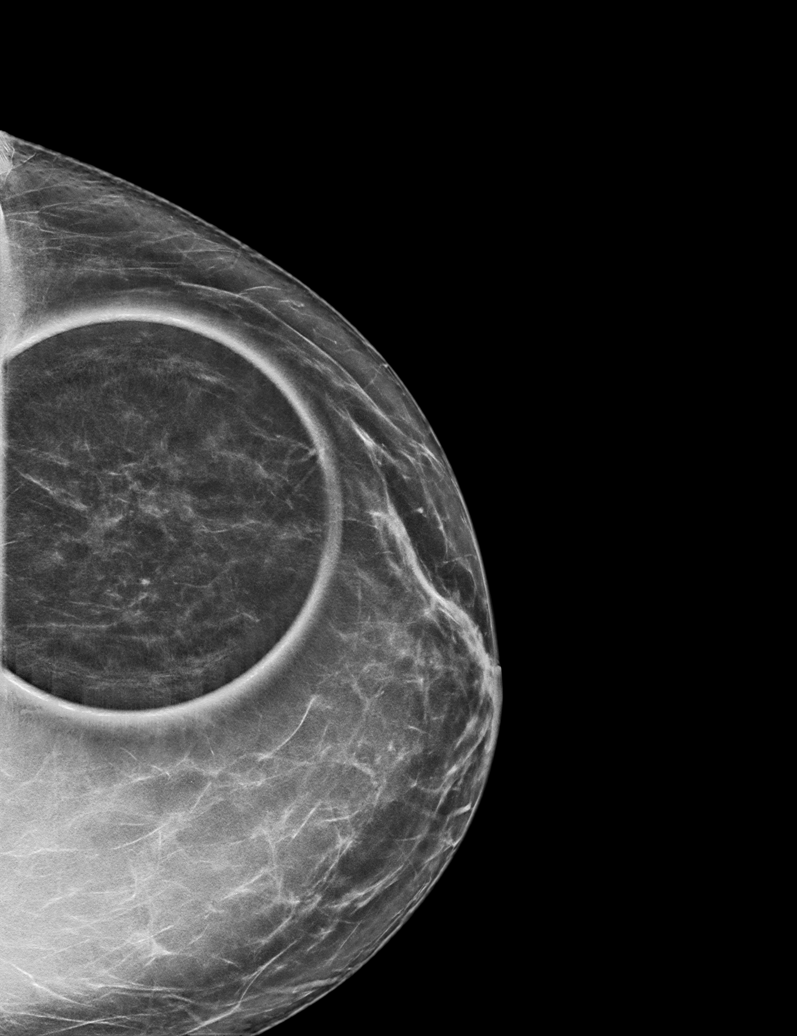

[L MLO tomo (1 of 2) · tomo slice 41/81.0]
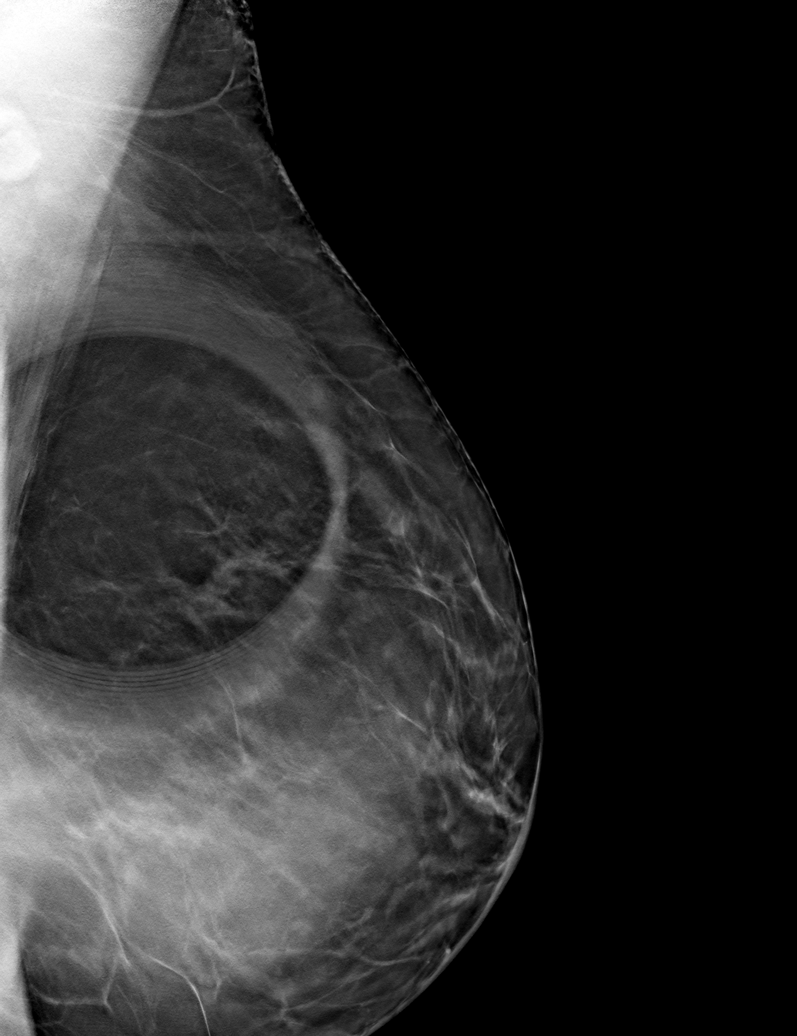

[L MLO tomo (2 of 2) · tomo slice 39/78.0]
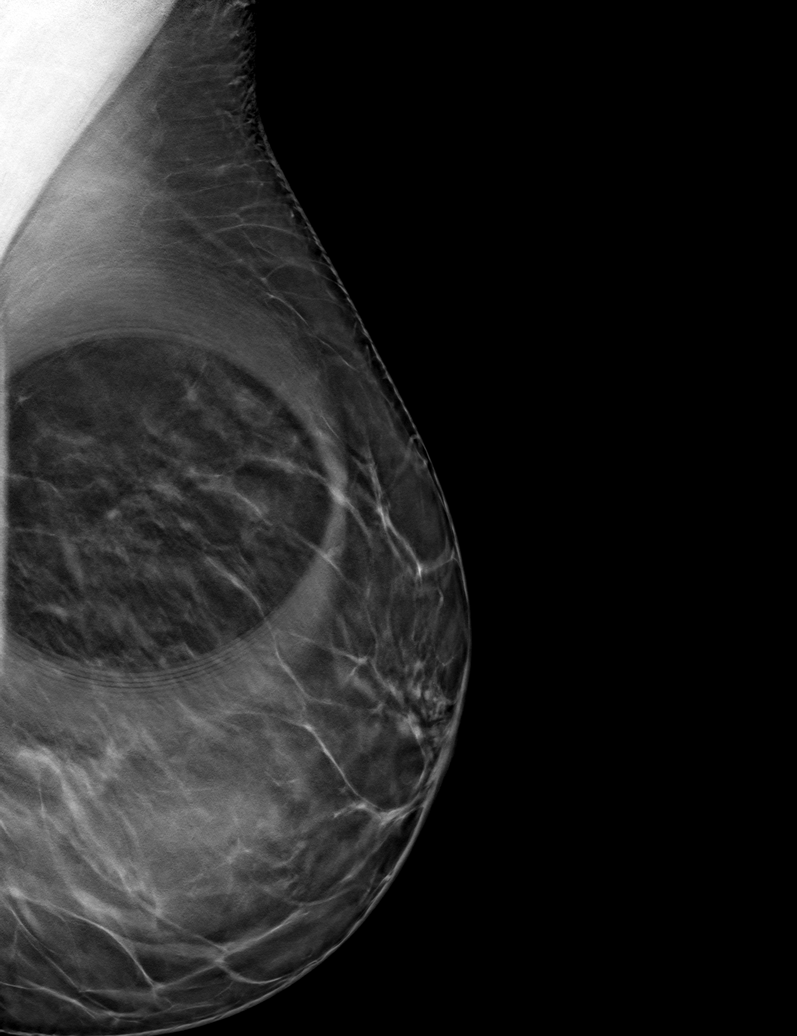

[L CC tomo · tomo slice 37/73.0]
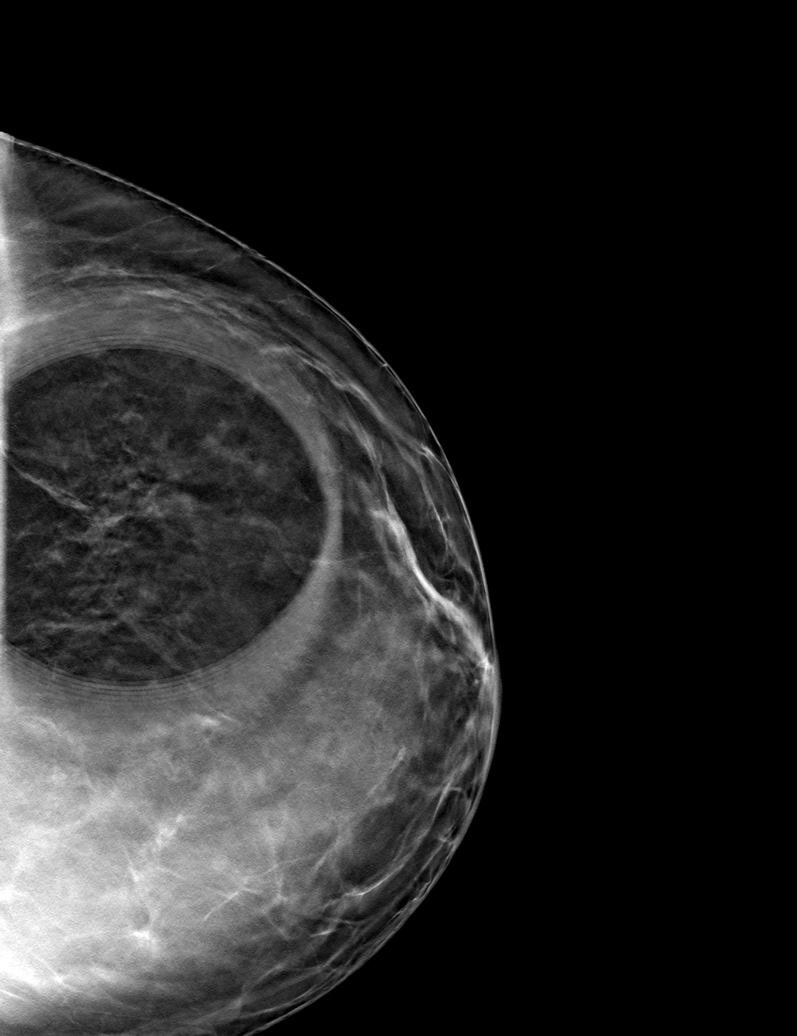

[6 of 18 positions shown; findings below may reference images not displayed]

ACR Breast Density Category b: There are scattered areas of
fibroglandular density.
FINDINGS: Focal spot compression views of the upper outer left breast with
tomography show dispersion of fibroglandular tissue. There is no
persistent asymmetry, mass, or distortion.

Mammographic images were processed with CAD.
IMPRESSION: No evidence of malignancy in the left breast.

RECOMMENDATION:
Screening mammogram in one year.(Code:87-4-BJX)

I have discussed the findings and recommendations with the patient.
Results were also provided in writing at the conclusion of the
visit. If applicable, a reminder letter will be sent to the patient
regarding the next appointment.

BI-RADS CATEGORY  1: Negative.

## 2020-06-20 ENCOUNTER — Other Ambulatory Visit (HOSPITAL_COMMUNITY): Payer: Self-pay

## 2020-06-20 MED ORDER — NYSTATIN 100000 UNIT/ML MT SUSP
10.0000 mL | OROMUCOSAL | 3 refills | Status: DC | PRN
  Filled 2020-06-20: qty 240, 4d supply, fill #0
  Filled 2020-12-15: qty 240, 4d supply, fill #1

## 2020-06-28 ENCOUNTER — Other Ambulatory Visit (HOSPITAL_COMMUNITY): Payer: Self-pay

## 2020-06-28 MED ORDER — XIIDRA 5 % OP SOLN
1.0000 [drp] | Freq: Two times a day (BID) | OPHTHALMIC | 3 refills | Status: DC
  Filled 2020-06-28: qty 180, 90d supply, fill #0
  Filled 2020-10-04: qty 180, 90d supply, fill #1
  Filled 2021-01-04: qty 180, 90d supply, fill #2
  Filled 2021-03-28: qty 180, 90d supply, fill #3

## 2020-06-28 MED FILL — Hydroxychloroquine Sulfate Tab 200 MG: ORAL | 30 days supply | Qty: 60 | Fill #0 | Status: AC

## 2020-06-30 ENCOUNTER — Other Ambulatory Visit (HOSPITAL_COMMUNITY): Payer: Self-pay

## 2020-06-30 MED ORDER — ACYCLOVIR 5 % EX OINT
TOPICAL_OINTMENT | CUTANEOUS | 0 refills | Status: DC
  Filled 2020-06-30: qty 30, 14d supply, fill #0

## 2020-06-30 MED ORDER — VALACYCLOVIR HCL 1 G PO TABS
1000.0000 mg | ORAL_TABLET | Freq: Two times a day (BID) | ORAL | 0 refills | Status: DC
  Filled 2020-06-30: qty 10, 5d supply, fill #0

## 2020-07-13 ENCOUNTER — Other Ambulatory Visit (HOSPITAL_COMMUNITY): Payer: Self-pay

## 2020-07-13 DIAGNOSIS — Z6827 Body mass index (BMI) 27.0-27.9, adult: Secondary | ICD-10-CM | POA: Diagnosis not present

## 2020-07-13 DIAGNOSIS — Z01419 Encounter for gynecological examination (general) (routine) without abnormal findings: Secondary | ICD-10-CM | POA: Diagnosis not present

## 2020-07-13 DIAGNOSIS — Z760 Encounter for issue of repeat prescription: Secondary | ICD-10-CM | POA: Diagnosis not present

## 2020-07-13 DIAGNOSIS — Z1231 Encounter for screening mammogram for malignant neoplasm of breast: Secondary | ICD-10-CM | POA: Diagnosis not present

## 2020-07-13 MED ORDER — NORETHIN ACE-ETH ESTRAD-FE 1-20 MG-MCG PO TABS
1.0000 | ORAL_TABLET | Freq: Every day | ORAL | 3 refills | Status: DC
  Filled 2020-07-13: qty 84, 84d supply, fill #0
  Filled 2020-10-04: qty 84, 84d supply, fill #1
  Filled 2021-01-04: qty 84, 84d supply, fill #2
  Filled 2021-05-24: qty 84, 84d supply, fill #3

## 2020-07-14 ENCOUNTER — Other Ambulatory Visit (HOSPITAL_COMMUNITY): Payer: Self-pay

## 2020-07-18 ENCOUNTER — Other Ambulatory Visit (HOSPITAL_COMMUNITY): Payer: Self-pay

## 2020-08-09 DIAGNOSIS — L738 Other specified follicular disorders: Secondary | ICD-10-CM | POA: Diagnosis not present

## 2020-08-09 DIAGNOSIS — L820 Inflamed seborrheic keratosis: Secondary | ICD-10-CM | POA: Diagnosis not present

## 2020-08-10 DIAGNOSIS — M3501 Sicca syndrome with keratoconjunctivitis: Secondary | ICD-10-CM | POA: Diagnosis not present

## 2020-08-10 DIAGNOSIS — H16223 Keratoconjunctivitis sicca, not specified as Sjogren's, bilateral: Secondary | ICD-10-CM | POA: Diagnosis not present

## 2020-08-10 DIAGNOSIS — Z79899 Other long term (current) drug therapy: Secondary | ICD-10-CM | POA: Diagnosis not present

## 2020-09-29 ENCOUNTER — Other Ambulatory Visit (HOSPITAL_COMMUNITY): Payer: Self-pay

## 2020-09-29 DIAGNOSIS — H6691 Otitis media, unspecified, right ear: Secondary | ICD-10-CM | POA: Diagnosis not present

## 2020-09-29 DIAGNOSIS — R42 Dizziness and giddiness: Secondary | ICD-10-CM | POA: Diagnosis not present

## 2020-09-29 MED ORDER — AMOXICILLIN-POT CLAVULANATE 500-125 MG PO TABS
1.0000 | ORAL_TABLET | Freq: Three times a day (TID) | ORAL | 0 refills | Status: AC
  Filled 2020-09-29: qty 21, 7d supply, fill #0

## 2020-10-04 ENCOUNTER — Other Ambulatory Visit (HOSPITAL_COMMUNITY): Payer: Self-pay

## 2020-10-05 DIAGNOSIS — H814 Vertigo of central origin: Secondary | ICD-10-CM | POA: Diagnosis not present

## 2020-10-05 DIAGNOSIS — M2539 Other instability, other specified joint: Secondary | ICD-10-CM | POA: Diagnosis not present

## 2020-10-11 DIAGNOSIS — H814 Vertigo of central origin: Secondary | ICD-10-CM | POA: Diagnosis not present

## 2020-10-11 DIAGNOSIS — M2539 Other instability, other specified joint: Secondary | ICD-10-CM | POA: Diagnosis not present

## 2020-10-11 DIAGNOSIS — R110 Nausea: Secondary | ICD-10-CM | POA: Diagnosis not present

## 2020-10-13 DIAGNOSIS — R110 Nausea: Secondary | ICD-10-CM | POA: Diagnosis not present

## 2020-10-13 DIAGNOSIS — M2539 Other instability, other specified joint: Secondary | ICD-10-CM | POA: Diagnosis not present

## 2020-10-13 DIAGNOSIS — H814 Vertigo of central origin: Secondary | ICD-10-CM | POA: Diagnosis not present

## 2020-10-25 DIAGNOSIS — R110 Nausea: Secondary | ICD-10-CM | POA: Diagnosis not present

## 2020-10-25 DIAGNOSIS — M2539 Other instability, other specified joint: Secondary | ICD-10-CM | POA: Diagnosis not present

## 2020-10-25 DIAGNOSIS — H814 Vertigo of central origin: Secondary | ICD-10-CM | POA: Diagnosis not present

## 2020-10-31 ENCOUNTER — Other Ambulatory Visit: Payer: Self-pay

## 2020-10-31 DIAGNOSIS — R110 Nausea: Secondary | ICD-10-CM | POA: Diagnosis not present

## 2020-10-31 DIAGNOSIS — M2539 Other instability, other specified joint: Secondary | ICD-10-CM | POA: Diagnosis not present

## 2020-10-31 DIAGNOSIS — H814 Vertigo of central origin: Secondary | ICD-10-CM | POA: Diagnosis not present

## 2020-11-02 ENCOUNTER — Other Ambulatory Visit (HOSPITAL_COMMUNITY): Payer: Self-pay

## 2020-11-02 DIAGNOSIS — M3509 Sicca syndrome with other organ involvement: Secondary | ICD-10-CM | POA: Diagnosis not present

## 2020-11-02 DIAGNOSIS — Z0000 Encounter for general adult medical examination without abnormal findings: Secondary | ICD-10-CM | POA: Diagnosis not present

## 2020-11-02 DIAGNOSIS — Z13220 Encounter for screening for lipoid disorders: Secondary | ICD-10-CM | POA: Diagnosis not present

## 2020-11-02 DIAGNOSIS — K219 Gastro-esophageal reflux disease without esophagitis: Secondary | ICD-10-CM | POA: Diagnosis not present

## 2020-11-04 ENCOUNTER — Other Ambulatory Visit (HOSPITAL_COMMUNITY): Payer: Self-pay

## 2020-11-07 ENCOUNTER — Other Ambulatory Visit (HOSPITAL_COMMUNITY): Payer: Self-pay

## 2020-11-07 DIAGNOSIS — M2539 Other instability, other specified joint: Secondary | ICD-10-CM | POA: Diagnosis not present

## 2020-11-07 DIAGNOSIS — R110 Nausea: Secondary | ICD-10-CM | POA: Diagnosis not present

## 2020-11-07 DIAGNOSIS — H814 Vertigo of central origin: Secondary | ICD-10-CM | POA: Diagnosis not present

## 2020-11-07 MED ORDER — HYDROXYCHLOROQUINE SULFATE 200 MG PO TABS
200.0000 mg | ORAL_TABLET | Freq: Two times a day (BID) | ORAL | 0 refills | Status: DC
  Filled 2020-11-07: qty 60, 30d supply, fill #0

## 2020-11-10 ENCOUNTER — Other Ambulatory Visit (HOSPITAL_COMMUNITY): Payer: Self-pay

## 2020-11-25 ENCOUNTER — Other Ambulatory Visit (HOSPITAL_COMMUNITY): Payer: Self-pay

## 2020-11-28 ENCOUNTER — Other Ambulatory Visit (HOSPITAL_COMMUNITY): Payer: Self-pay

## 2020-11-28 MED ORDER — VALACYCLOVIR HCL 1 G PO TABS
1000.0000 mg | ORAL_TABLET | Freq: Two times a day (BID) | ORAL | 2 refills | Status: DC
  Filled 2020-11-28 – 2020-12-13 (×2): qty 10, 5d supply, fill #0
  Filled 2021-08-03: qty 10, 5d supply, fill #1

## 2020-12-04 DIAGNOSIS — Z03818 Encounter for observation for suspected exposure to other biological agents ruled out: Secondary | ICD-10-CM | POA: Diagnosis not present

## 2020-12-04 DIAGNOSIS — R059 Cough, unspecified: Secondary | ICD-10-CM | POA: Diagnosis not present

## 2020-12-04 DIAGNOSIS — J029 Acute pharyngitis, unspecified: Secondary | ICD-10-CM | POA: Diagnosis not present

## 2020-12-04 DIAGNOSIS — B349 Viral infection, unspecified: Secondary | ICD-10-CM | POA: Diagnosis not present

## 2020-12-05 ENCOUNTER — Other Ambulatory Visit (HOSPITAL_COMMUNITY): Payer: Self-pay

## 2020-12-13 ENCOUNTER — Other Ambulatory Visit (HOSPITAL_COMMUNITY): Payer: Self-pay

## 2020-12-14 ENCOUNTER — Other Ambulatory Visit (HOSPITAL_COMMUNITY): Payer: Self-pay

## 2020-12-15 ENCOUNTER — Other Ambulatory Visit (HOSPITAL_COMMUNITY): Payer: Self-pay

## 2020-12-16 ENCOUNTER — Other Ambulatory Visit (HOSPITAL_COMMUNITY): Payer: Self-pay

## 2020-12-22 ENCOUNTER — Other Ambulatory Visit (HOSPITAL_COMMUNITY): Payer: Self-pay

## 2020-12-22 MED ORDER — SCOPOLAMINE 1 MG/3DAYS TD PT72
1.0000 | MEDICATED_PATCH | TRANSDERMAL | 0 refills | Status: DC
  Filled 2020-12-22: qty 3, 9d supply, fill #0

## 2020-12-23 DIAGNOSIS — Z79899 Other long term (current) drug therapy: Secondary | ICD-10-CM | POA: Diagnosis not present

## 2020-12-23 DIAGNOSIS — M3500 Sicca syndrome, unspecified: Secondary | ICD-10-CM | POA: Diagnosis not present

## 2021-01-04 ENCOUNTER — Other Ambulatory Visit: Payer: Self-pay

## 2021-01-04 ENCOUNTER — Other Ambulatory Visit (HOSPITAL_COMMUNITY): Payer: Self-pay

## 2021-01-04 MED ORDER — NORETHIN ACE-ETH ESTRAD-FE 1-20 MG-MCG PO TABS
1.0000 | ORAL_TABLET | Freq: Every day | ORAL | 6 refills | Status: DC
  Filled 2021-01-04: qty 28, 28d supply, fill #0
  Filled 2021-03-28: qty 84, 84d supply, fill #0

## 2021-01-04 MED ORDER — NORETHIN ACE-ETH ESTRAD-FE 1-20 MG-MCG PO TABS
1.0000 | ORAL_TABLET | Freq: Every day | ORAL | 6 refills | Status: DC
  Filled 2021-01-04: qty 84, 84d supply, fill #0

## 2021-01-04 MED ORDER — HYDROXYCHLOROQUINE SULFATE 200 MG PO TABS
200.0000 mg | ORAL_TABLET | Freq: Two times a day (BID) | ORAL | 5 refills | Status: DC
  Filled 2021-01-04: qty 60, 30d supply, fill #0
  Filled 2021-03-01: qty 60, 30d supply, fill #1
  Filled 2021-08-21: qty 60, 30d supply, fill #2
  Filled 2021-09-26: qty 60, 30d supply, fill #3
  Filled 2021-11-21: qty 60, 30d supply, fill #4

## 2021-01-18 DIAGNOSIS — E663 Overweight: Secondary | ICD-10-CM | POA: Diagnosis not present

## 2021-01-18 DIAGNOSIS — M3500 Sicca syndrome, unspecified: Secondary | ICD-10-CM | POA: Diagnosis not present

## 2021-01-18 DIAGNOSIS — Z6827 Body mass index (BMI) 27.0-27.9, adult: Secondary | ICD-10-CM | POA: Diagnosis not present

## 2021-01-18 DIAGNOSIS — Z79899 Other long term (current) drug therapy: Secondary | ICD-10-CM | POA: Diagnosis not present

## 2021-03-01 ENCOUNTER — Other Ambulatory Visit (HOSPITAL_COMMUNITY): Payer: Self-pay

## 2021-03-28 ENCOUNTER — Other Ambulatory Visit (HOSPITAL_COMMUNITY): Payer: Self-pay

## 2021-04-03 DIAGNOSIS — L985 Mucinosis of the skin: Secondary | ICD-10-CM | POA: Diagnosis not present

## 2021-05-08 DIAGNOSIS — H0288A Meibomian gland dysfunction right eye, upper and lower eyelids: Secondary | ICD-10-CM | POA: Diagnosis not present

## 2021-05-08 DIAGNOSIS — H0288B Meibomian gland dysfunction left eye, upper and lower eyelids: Secondary | ICD-10-CM | POA: Diagnosis not present

## 2021-05-08 DIAGNOSIS — Z79899 Other long term (current) drug therapy: Secondary | ICD-10-CM | POA: Diagnosis not present

## 2021-05-08 DIAGNOSIS — M3501 Sicca syndrome with keratoconjunctivitis: Secondary | ICD-10-CM | POA: Diagnosis not present

## 2021-05-24 ENCOUNTER — Other Ambulatory Visit (HOSPITAL_COMMUNITY): Payer: Self-pay

## 2021-05-24 MED ORDER — PANTOPRAZOLE SODIUM 40 MG PO TBEC
40.0000 mg | DELAYED_RELEASE_TABLET | Freq: Every day | ORAL | 0 refills | Status: DC
Start: 2021-05-24 — End: 2021-07-28
  Filled 2021-05-24: qty 90, 90d supply, fill #0

## 2021-05-29 ENCOUNTER — Other Ambulatory Visit (HOSPITAL_COMMUNITY): Payer: Self-pay

## 2021-05-30 ENCOUNTER — Other Ambulatory Visit (HOSPITAL_COMMUNITY): Payer: Self-pay

## 2021-06-02 ENCOUNTER — Other Ambulatory Visit (HOSPITAL_COMMUNITY): Payer: Self-pay

## 2021-06-06 ENCOUNTER — Other Ambulatory Visit (HOSPITAL_COMMUNITY): Payer: Self-pay

## 2021-06-19 DIAGNOSIS — Z6827 Body mass index (BMI) 27.0-27.9, adult: Secondary | ICD-10-CM | POA: Diagnosis not present

## 2021-06-19 DIAGNOSIS — K111 Hypertrophy of salivary gland: Secondary | ICD-10-CM | POA: Diagnosis not present

## 2021-06-19 DIAGNOSIS — Z79899 Other long term (current) drug therapy: Secondary | ICD-10-CM | POA: Diagnosis not present

## 2021-06-19 DIAGNOSIS — M3500 Sicca syndrome, unspecified: Secondary | ICD-10-CM | POA: Diagnosis not present

## 2021-06-19 DIAGNOSIS — E663 Overweight: Secondary | ICD-10-CM | POA: Diagnosis not present

## 2021-07-10 ENCOUNTER — Other Ambulatory Visit (HOSPITAL_COMMUNITY): Payer: Self-pay

## 2021-07-17 ENCOUNTER — Other Ambulatory Visit (HOSPITAL_COMMUNITY): Payer: Self-pay

## 2021-07-17 MED ORDER — XIIDRA 5 % OP SOLN
1.0000 [drp] | Freq: Two times a day (BID) | OPHTHALMIC | 3 refills | Status: DC
  Filled 2021-07-17: qty 180, 90d supply, fill #0
  Filled 2021-09-26: qty 180, 90d supply, fill #1
  Filled 2021-12-28: qty 180, 90d supply, fill #2

## 2021-07-28 ENCOUNTER — Other Ambulatory Visit (HOSPITAL_COMMUNITY): Payer: Self-pay

## 2021-07-28 DIAGNOSIS — Z6826 Body mass index (BMI) 26.0-26.9, adult: Secondary | ICD-10-CM | POA: Diagnosis not present

## 2021-07-28 DIAGNOSIS — Z01419 Encounter for gynecological examination (general) (routine) without abnormal findings: Secondary | ICD-10-CM | POA: Diagnosis not present

## 2021-07-28 DIAGNOSIS — Z124 Encounter for screening for malignant neoplasm of cervix: Secondary | ICD-10-CM | POA: Diagnosis not present

## 2021-07-28 DIAGNOSIS — Z1231 Encounter for screening mammogram for malignant neoplasm of breast: Secondary | ICD-10-CM | POA: Diagnosis not present

## 2021-07-28 MED ORDER — PANTOPRAZOLE SODIUM 40 MG PO TBEC
40.0000 mg | DELAYED_RELEASE_TABLET | Freq: Every day | ORAL | 3 refills | Status: DC
  Filled 2021-07-28 – 2021-08-21 (×2): qty 90, 90d supply, fill #0
  Filled 2021-11-21: qty 90, 90d supply, fill #1
  Filled 2022-02-13: qty 90, 90d supply, fill #2
  Filled 2022-05-25: qty 90, 90d supply, fill #3

## 2021-07-28 MED ORDER — NORETHIN ACE-ETH ESTRAD-FE 1-20 MG-MCG PO TABS
1.0000 | ORAL_TABLET | Freq: Every day | ORAL | 3 refills | Status: DC
  Filled 2021-07-28 – 2021-11-06 (×2): qty 84, 84d supply, fill #0
  Filled 2022-02-01: qty 84, 84d supply, fill #1
  Filled 2022-04-23: qty 84, 84d supply, fill #2
  Filled 2022-07-14: qty 84, 84d supply, fill #3

## 2021-07-31 ENCOUNTER — Other Ambulatory Visit (HOSPITAL_COMMUNITY): Payer: Self-pay

## 2021-07-31 DIAGNOSIS — J029 Acute pharyngitis, unspecified: Secondary | ICD-10-CM | POA: Diagnosis not present

## 2021-07-31 MED ORDER — AMOXICILLIN 500 MG PO CAPS
500.0000 mg | ORAL_CAPSULE | Freq: Three times a day (TID) | ORAL | 0 refills | Status: DC
  Filled 2021-07-31: qty 30, 10d supply, fill #0

## 2021-08-03 ENCOUNTER — Other Ambulatory Visit (HOSPITAL_COMMUNITY): Payer: Self-pay

## 2021-08-03 MED ORDER — NYSTATIN 100000 UNIT/ML MT SUSP
10.0000 mL | OROMUCOSAL | 3 refills | Status: DC | PRN
  Filled 2021-08-03 (×2): qty 240, 4d supply, fill #0

## 2021-08-04 ENCOUNTER — Other Ambulatory Visit (HOSPITAL_COMMUNITY): Payer: Self-pay

## 2021-08-21 ENCOUNTER — Other Ambulatory Visit (HOSPITAL_COMMUNITY): Payer: Self-pay

## 2021-09-06 ENCOUNTER — Other Ambulatory Visit (HOSPITAL_BASED_OUTPATIENT_CLINIC_OR_DEPARTMENT_OTHER): Payer: Self-pay

## 2021-09-26 ENCOUNTER — Other Ambulatory Visit (HOSPITAL_COMMUNITY): Payer: Self-pay

## 2021-09-27 ENCOUNTER — Other Ambulatory Visit (HOSPITAL_COMMUNITY): Payer: Self-pay

## 2021-09-28 DIAGNOSIS — M3501 Sicca syndrome with keratoconjunctivitis: Secondary | ICD-10-CM | POA: Diagnosis not present

## 2021-11-03 DIAGNOSIS — K219 Gastro-esophageal reflux disease without esophagitis: Secondary | ICD-10-CM | POA: Diagnosis not present

## 2021-11-03 DIAGNOSIS — M3509 Sicca syndrome with other organ involvement: Secondary | ICD-10-CM | POA: Diagnosis not present

## 2021-11-03 DIAGNOSIS — Z13220 Encounter for screening for lipoid disorders: Secondary | ICD-10-CM | POA: Diagnosis not present

## 2021-11-03 DIAGNOSIS — Z0000 Encounter for general adult medical examination without abnormal findings: Secondary | ICD-10-CM | POA: Diagnosis not present

## 2021-11-03 DIAGNOSIS — Z6827 Body mass index (BMI) 27.0-27.9, adult: Secondary | ICD-10-CM | POA: Diagnosis not present

## 2021-11-03 DIAGNOSIS — Z131 Encounter for screening for diabetes mellitus: Secondary | ICD-10-CM | POA: Diagnosis not present

## 2021-11-06 ENCOUNTER — Other Ambulatory Visit (HOSPITAL_COMMUNITY): Payer: Self-pay

## 2021-11-21 ENCOUNTER — Other Ambulatory Visit (HOSPITAL_COMMUNITY): Payer: Self-pay

## 2021-11-24 DIAGNOSIS — J45991 Cough variant asthma: Secondary | ICD-10-CM | POA: Diagnosis not present

## 2021-12-19 DIAGNOSIS — M3501 Sicca syndrome with keratoconjunctivitis: Secondary | ICD-10-CM | POA: Diagnosis not present

## 2021-12-19 DIAGNOSIS — E663 Overweight: Secondary | ICD-10-CM | POA: Diagnosis not present

## 2021-12-19 DIAGNOSIS — Z6827 Body mass index (BMI) 27.0-27.9, adult: Secondary | ICD-10-CM | POA: Diagnosis not present

## 2021-12-19 DIAGNOSIS — Z79899 Other long term (current) drug therapy: Secondary | ICD-10-CM | POA: Diagnosis not present

## 2021-12-19 DIAGNOSIS — K111 Hypertrophy of salivary gland: Secondary | ICD-10-CM | POA: Diagnosis not present

## 2021-12-28 ENCOUNTER — Other Ambulatory Visit (HOSPITAL_COMMUNITY): Payer: Self-pay

## 2022-01-02 ENCOUNTER — Ambulatory Visit
Admission: EM | Admit: 2022-01-02 | Discharge: 2022-01-02 | Disposition: A | Discharge status: Home / Self Care | Payer: 59 | Attending: Urgent Care | Admitting: Urgent Care

## 2022-01-02 DIAGNOSIS — J020 Streptococcal pharyngitis: Secondary | ICD-10-CM | POA: Diagnosis not present

## 2022-01-02 LAB — POCT RAPID STREP A (OFFICE): Rapid Strep A Screen: POSITIVE — AB

## 2022-01-02 MED ORDER — ONDANSETRON 4 MG PO TBDP: 4.0000 mg | ORAL_TABLET | Freq: Three times a day (TID) | ORAL | 0 refills | Status: AC | PRN

## 2022-01-02 MED ORDER — PENICILLIN V POTASSIUM 500 MG PO TABS: 500.0000 mg | ORAL_TABLET | Freq: Two times a day (BID) | ORAL | 0 refills | Status: AC

## 2022-01-02 NOTE — Discharge Instructions (Addendum)
Your rapid strep was positive for strep throat. Please start taking antibiotics as prescribed. Do not stop taking them until all has been completed. After completing the third day of antibiotics, throw away your current toothbrush and get a new one. This will prevent re-contamination. You may alternate ibuprofen and tylenol every 4 hours for fever and pain control. Do not share food, beverages, or kiss on the lips until >48 hours after starting antibiotics and >24 hours after fever resolved. This is contagious. Take zofran as needed for nausea/ vomiting.

## 2022-01-02 NOTE — ED Triage Notes (Addendum)
Patient presents to UC for sore throat, bilateral ear pain, body aches, chills, fever, and HA since yeterday. Treating with mucinex and tylenol.

## 2022-01-02 NOTE — ED Provider Notes (Signed)
[facility]    CSN: [id number] Arrival date & time: 01/02/22  1035      History   Chief Complaint Chief Complaint  Patient presents with   Sore Throat    Entered by patient   Fever   Chills   Generalized Body Aches   Headache    HPI Traci White is a 46 y.o. female.   46 year old female presents today due to concerns of possible strep.  She reports since yesterday she has had a fever of up to 102, headache, severe sore throat, and swollen lymph nodes.  She reports some nausea but no vomiting.  Decreased appetite.  No rash.  No change in bowel movements.  She admits to a history of strep in the past.   Sore Throat Associated symptoms include headaches.  Fever Associated symptoms: headaches   Headache Associated symptoms: fever     Past Medical History:  Diagnosis Date   GERD (gastroesophageal reflux disease)     There are no problems to display for this patient.   Past Surgical History:  Procedure Laterality Date   BUNIONECTOMY Right 11/2015   CESAREAN SECTION  05/14/2005   CESAREAN SECTION  05/2009   WISDOM TOOTH EXTRACTION  1999    OB History   No obstetric history on file.      Home Medications    Prior to Admission medications   Medication Sig Start Date End Date Taking? Authorizing Provider  ondansetron (ZOFRAN-ODT) 4 MG disintegrating tablet Take 1 tablet (4 mg total) by mouth every 8 (eight) hours as needed for nausea or vomiting. 01/02/22  Yes Alexandrina Fiorini L, PA  penicillin v potassium (VEETID) 500 MG tablet Take 1 tablet (500 mg total) by mouth in the morning and at bedtime for 10 days. 01/02/22 01/12/22 Yes Demontrae Gilbert L, PA  norethindrone-ethinyl estradiol (LOESTRIN FE) 1-20 MG-MCG tablet TAKE 1 TABLET BY MOUTH ONCE A DAY 03/16/19 03/15/20  Dian Queen, MD  norethindrone-ethinyl estradiol-FE (BLISOVI FE 1/20) 1-20 MG-MCG tablet Take 1 tablet by mouth daily. 07/28/21     pantoprazole (PROTONIX) 40 MG tablet Take 1 tablet (40  mg total) by mouth daily. 07/28/21       Family History Family History  Problem Relation Age of Onset   Diabetes Mellitus II Mother    Heart disease Mother    Heart attack Mother    High Cholesterol Father    High blood pressure Father    Other Father        esophageal reflux   Healthy Brother     Social History Social History   Tobacco Use   Smoking status: Never   Smokeless tobacco: Never  Vaping Use   Vaping Use: Never used  Substance Use Topics   Alcohol use: No   Drug use: No     Allergies   Patient has no known allergies.   Review of Systems Review of Systems  Constitutional:  Positive for fever.  Neurological:  Positive for headaches.     Physical Exam Triage Vital Signs ED Triage Vitals  Enc Vitals Group     BP 01/02/22 1123 128/85     Pulse Rate 01/02/22 1123 (!) 133     Resp 01/02/22 1123 16     Temp 01/02/22 1123 98.6 F (37 C)     Temp Source 01/02/22 1123 Oral     SpO2 01/02/22 1123 98 %     Weight --      Height --  Head Circumference --      Peak Flow --      Pain Score 01/02/22 1120 7     Pain Loc --      Pain Edu? --      Excl. in GC? --    No data found.  Updated Vital Signs BP 128/85 (BP Location: Left Arm)   Pulse (!) 133   Temp 98.6 F (37 C) (Oral)   Resp 16   LMP 11/02/2021   SpO2 98%   Visual Acuity Right Eye Distance:   Left Eye Distance:   Bilateral Distance:    Right Eye Near:   Left Eye Near:    Bilateral Near:     Physical Exam Vitals and nursing note reviewed.  Constitutional:      General: She is not in acute distress.    Appearance: She is well-developed and normal weight. She is ill-appearing. She is not toxic-appearing or diaphoretic.  HENT:     Head: Normocephalic and atraumatic.     Right Ear: Tympanic membrane and ear canal normal. No drainage, swelling or tenderness. No middle ear effusion. Tympanic membrane is not erythematous.     Left Ear: Tympanic membrane and ear canal normal. No  drainage, swelling or tenderness.  No middle ear effusion. Tympanic membrane is not erythematous.     Nose: No congestion or rhinorrhea.     Mouth/Throat:     Mouth: Mucous membranes are moist.     Pharynx: Uvula midline. Oropharyngeal exudate and posterior oropharyngeal erythema present. No pharyngeal swelling or uvula swelling.  Eyes:     Conjunctiva/sclera: Conjunctivae normal.     Pupils: Pupils are equal, round, and reactive to light.  Neck:     Thyroid: No thyromegaly.  Cardiovascular:     Rate and Rhythm: Regular rhythm. Tachycardia present.     Heart sounds: Normal heart sounds. No murmur heard.    No friction rub. No gallop.  Pulmonary:     Effort: Pulmonary effort is normal. No respiratory distress.     Breath sounds: Normal breath sounds. No stridor. No wheezing, rhonchi or rales.  Chest:     Chest wall: No tenderness.  Abdominal:     General: There is no distension.     Palpations: Abdomen is soft.     Tenderness: There is no abdominal tenderness. There is no rebound.  Musculoskeletal:     Cervical back: Normal range of motion and neck supple.  Lymphadenopathy:     Cervical: Cervical adenopathy present.  Skin:    General: Skin is warm.     Capillary Refill: Capillary refill takes less than 2 seconds.  Neurological:     General: No focal deficit present.     Mental Status: She is alert and oriented to person, place, and time.  Psychiatric:        Mood and Affect: Mood normal.      UC Treatments / Results  Labs (all labs ordered are listed, but only abnormal results are displayed) Labs Reviewed  POCT RAPID STREP A (OFFICE) - Abnormal; Notable for the following components:      Result Value   Rapid Strep A Screen Positive (*)    All other components within normal limits    EKG   Radiology No results found.  Procedures Procedures (including critical care time)  Medications Ordered in UC Medications - No data to display  Initial Impression /  Assessment and Plan / UC Course  I have reviewed the triage  vital signs and the nursing notes.  Pertinent labs & imaging results that were available during my care of the patient were reviewed by me and considered in my medical decision making (see chart for details).  Clinical Course as of 01/02/22 1218  Tue Jan 02, 2022  1217 Recheck pulse rate 120 [WC]    Clinical Course User Index [WC] Geryl Councilman L, [place]    Strep throat -will start penicillin VK twice daily x 10 days.  Additional supportive measures reviewed.  Encouraged hydration.  Zofran as needed for nausea or vomiting.  Discussed antipyretic therapy.  Recheck heart rate it did come down by 13 bpm, patient admits to a chronically elevated heart rate.  Final Clinical Impressions(s) / UC Diagnoses   Final diagnoses:  Streptococcal sore throat     Discharge Instructions      Your rapid strep was positive for strep throat. Please start taking antibiotics as prescribed. Do not stop taking them until all has been completed. After completing the third day of antibiotics, throw away your current toothbrush and get a new one. This will prevent re-contamination. You may alternate ibuprofen and tylenol every 4 hours for fever and pain control. Do not share food, beverages, or kiss on the lips until >48 hours after starting antibiotics and >24 hours after fever resolved. This is contagious. Take zofran as needed for nausea/ vomiting.       ED Prescriptions     Medication Sig Dispense Auth. Provider   penicillin v potassium (VEETID) 500 MG tablet Take 1 tablet (500 mg total) by mouth in the morning and at bedtime for 10 days. 20 tablet Kanisha Duba L, PA   ondansetron (ZOFRAN-ODT) 4 MG disintegrating tablet Take 1 tablet (4 mg total) by mouth every 8 (eight) hours as needed for nausea or vomiting. 20 tablet Chrishun Scheer L, [place]      PDMP not reviewed this encounter.   Chaney Malling, [place] 01/02/22 1219

## 2022-02-01 ENCOUNTER — Other Ambulatory Visit (HOSPITAL_COMMUNITY): Payer: Self-pay

## 2022-02-13 ENCOUNTER — Other Ambulatory Visit (HOSPITAL_COMMUNITY): Payer: Self-pay

## 2022-03-28 ENCOUNTER — Other Ambulatory Visit (HOSPITAL_COMMUNITY): Payer: Self-pay

## 2022-03-28 MED ORDER — XIIDRA 5 % OP SOLN
1.0000 [drp] | Freq: Two times a day (BID) | OPHTHALMIC | 3 refills | Status: AC
  Filled 2022-03-28: qty 180, 90d supply, fill #0

## 2022-03-29 ENCOUNTER — Other Ambulatory Visit (HOSPITAL_COMMUNITY): Payer: Self-pay

## 2022-03-29 MED ORDER — RESTASIS 0.05 % OP EMUL
1.0000 [drp] | Freq: Two times a day (BID) | OPHTHALMIC | 0 refills | Status: AC
  Filled 2022-03-29: qty 180, 90d supply, fill #0

## 2022-04-23 ENCOUNTER — Other Ambulatory Visit (HOSPITAL_COMMUNITY): Payer: Self-pay

## 2022-05-14 DIAGNOSIS — H0288B Meibomian gland dysfunction left eye, upper and lower eyelids: Secondary | ICD-10-CM | POA: Diagnosis not present

## 2022-05-14 DIAGNOSIS — M3501 Sicca syndrome with keratoconjunctivitis: Secondary | ICD-10-CM | POA: Diagnosis not present

## 2022-05-14 DIAGNOSIS — Z79899 Other long term (current) drug therapy: Secondary | ICD-10-CM | POA: Diagnosis not present

## 2022-05-14 DIAGNOSIS — H0288A Meibomian gland dysfunction right eye, upper and lower eyelids: Secondary | ICD-10-CM | POA: Diagnosis not present

## 2022-05-21 DIAGNOSIS — C44319 Basal cell carcinoma of skin of other parts of face: Secondary | ICD-10-CM | POA: Diagnosis not present

## 2022-05-25 ENCOUNTER — Other Ambulatory Visit (HOSPITAL_COMMUNITY): Payer: Self-pay

## 2022-06-13 DIAGNOSIS — C4441 Basal cell carcinoma of skin of scalp and neck: Secondary | ICD-10-CM | POA: Diagnosis not present

## 2022-07-09 DIAGNOSIS — C44319 Basal cell carcinoma of skin of other parts of face: Secondary | ICD-10-CM | POA: Diagnosis not present

## 2022-07-14 ENCOUNTER — Other Ambulatory Visit (HOSPITAL_COMMUNITY): Payer: Self-pay

## 2022-07-16 ENCOUNTER — Other Ambulatory Visit: Payer: Self-pay

## 2022-07-16 ENCOUNTER — Other Ambulatory Visit (HOSPITAL_COMMUNITY): Payer: Self-pay

## 2022-07-16 MED ORDER — RESTASIS 0.05 % OP EMUL
1.0000 [drp] | Freq: Two times a day (BID) | OPHTHALMIC | 0 refills | Status: DC
  Filled 2022-07-16: qty 180, 90d supply, fill #0

## 2022-07-17 ENCOUNTER — Other Ambulatory Visit (HOSPITAL_COMMUNITY): Payer: Self-pay

## 2022-07-31 ENCOUNTER — Other Ambulatory Visit (HOSPITAL_COMMUNITY): Payer: Self-pay

## 2022-07-31 DIAGNOSIS — Z01419 Encounter for gynecological examination (general) (routine) without abnormal findings: Secondary | ICD-10-CM | POA: Diagnosis not present

## 2022-07-31 DIAGNOSIS — Z1231 Encounter for screening mammogram for malignant neoplasm of breast: Secondary | ICD-10-CM | POA: Diagnosis not present

## 2022-07-31 DIAGNOSIS — Z6827 Body mass index (BMI) 27.0-27.9, adult: Secondary | ICD-10-CM | POA: Diagnosis not present

## 2022-07-31 MED ORDER — NORETHIN ACE-ETH ESTRAD-FE 1-20 MG-MCG PO TABS
1.0000 | ORAL_TABLET | Freq: Every day | ORAL | 3 refills | Status: DC
  Filled 2022-10-04: qty 84, 84d supply, fill #0

## 2022-08-06 DIAGNOSIS — Z08 Encounter for follow-up examination after completed treatment for malignant neoplasm: Secondary | ICD-10-CM | POA: Diagnosis not present

## 2022-08-06 DIAGNOSIS — M7711 Lateral epicondylitis, right elbow: Secondary | ICD-10-CM | POA: Diagnosis not present

## 2022-08-06 DIAGNOSIS — Z6829 Body mass index (BMI) 29.0-29.9, adult: Secondary | ICD-10-CM | POA: Diagnosis not present

## 2022-08-06 DIAGNOSIS — K111 Hypertrophy of salivary gland: Secondary | ICD-10-CM | POA: Diagnosis not present

## 2022-08-06 DIAGNOSIS — Z85828 Personal history of other malignant neoplasm of skin: Secondary | ICD-10-CM | POA: Diagnosis not present

## 2022-08-06 DIAGNOSIS — E663 Overweight: Secondary | ICD-10-CM | POA: Diagnosis not present

## 2022-08-06 DIAGNOSIS — Z79899 Other long term (current) drug therapy: Secondary | ICD-10-CM | POA: Diagnosis not present

## 2022-08-06 DIAGNOSIS — M3501 Sicca syndrome with keratoconjunctivitis: Secondary | ICD-10-CM | POA: Diagnosis not present

## 2022-08-26 DIAGNOSIS — R051 Acute cough: Secondary | ICD-10-CM | POA: Diagnosis not present

## 2022-08-26 DIAGNOSIS — R52 Pain, unspecified: Secondary | ICD-10-CM | POA: Diagnosis not present

## 2022-08-26 DIAGNOSIS — R509 Fever, unspecified: Secondary | ICD-10-CM | POA: Diagnosis not present

## 2022-08-26 DIAGNOSIS — U071 COVID-19: Secondary | ICD-10-CM | POA: Diagnosis not present

## 2022-08-26 DIAGNOSIS — R5383 Other fatigue: Secondary | ICD-10-CM | POA: Diagnosis not present

## 2022-08-26 DIAGNOSIS — J029 Acute pharyngitis, unspecified: Secondary | ICD-10-CM | POA: Diagnosis not present

## 2022-09-17 DIAGNOSIS — Z48817 Encounter for surgical aftercare following surgery on the skin and subcutaneous tissue: Secondary | ICD-10-CM | POA: Diagnosis not present

## 2022-10-04 ENCOUNTER — Other Ambulatory Visit (HOSPITAL_COMMUNITY): Payer: Self-pay

## 2022-10-04 MED ORDER — PANTOPRAZOLE SODIUM 40 MG PO TBEC
40.0000 mg | DELAYED_RELEASE_TABLET | Freq: Every day | ORAL | 3 refills | Status: AC
  Filled 2022-10-04: qty 90, 90d supply, fill #0

## 2022-10-17 ENCOUNTER — Other Ambulatory Visit (HOSPITAL_COMMUNITY): Payer: Self-pay

## 2022-10-17 MED ORDER — ACYCLOVIR 5 % EX OINT
1.0000 | TOPICAL_OINTMENT | Freq: Every day | CUTANEOUS | 0 refills | Status: AC
  Filled 2022-10-17: qty 30, 6d supply, fill #0

## 2022-11-05 ENCOUNTER — Other Ambulatory Visit (HOSPITAL_COMMUNITY): Payer: Self-pay

## 2022-11-05 MED ORDER — RESTASIS 0.05 % OP EMUL
1.0000 [drp] | Freq: Two times a day (BID) | OPHTHALMIC | 1 refills | Status: DC
  Filled 2022-11-05: qty 180, 90d supply, fill #0
  Filled 2023-02-01: qty 180, 90d supply, fill #1

## 2022-11-06 ENCOUNTER — Other Ambulatory Visit (HOSPITAL_COMMUNITY): Payer: Self-pay

## 2022-11-08 ENCOUNTER — Other Ambulatory Visit (HOSPITAL_COMMUNITY): Payer: Self-pay

## 2022-11-19 DIAGNOSIS — K219 Gastro-esophageal reflux disease without esophagitis: Secondary | ICD-10-CM | POA: Diagnosis not present

## 2022-11-19 DIAGNOSIS — Z0000 Encounter for general adult medical examination without abnormal findings: Secondary | ICD-10-CM | POA: Diagnosis not present

## 2022-11-19 DIAGNOSIS — Z6827 Body mass index (BMI) 27.0-27.9, adult: Secondary | ICD-10-CM | POA: Diagnosis not present

## 2022-11-19 DIAGNOSIS — Z13220 Encounter for screening for lipoid disorders: Secondary | ICD-10-CM | POA: Diagnosis not present

## 2022-11-19 DIAGNOSIS — M3509 Sicca syndrome with other organ involvement: Secondary | ICD-10-CM | POA: Diagnosis not present

## 2022-11-29 ENCOUNTER — Other Ambulatory Visit (HOSPITAL_COMMUNITY): Payer: Self-pay

## 2022-11-29 DIAGNOSIS — K219 Gastro-esophageal reflux disease without esophagitis: Secondary | ICD-10-CM | POA: Diagnosis not present

## 2022-11-29 DIAGNOSIS — Z8739 Personal history of other diseases of the musculoskeletal system and connective tissue: Secondary | ICD-10-CM | POA: Diagnosis not present

## 2022-11-29 DIAGNOSIS — Z1211 Encounter for screening for malignant neoplasm of colon: Secondary | ICD-10-CM | POA: Diagnosis not present

## 2022-11-29 MED ORDER — VALACYCLOVIR HCL 1 G PO TABS
1000.0000 mg | ORAL_TABLET | Freq: Two times a day (BID) | ORAL | 2 refills | Status: AC
  Filled 2022-11-29: qty 10, 5d supply, fill #0
  Filled 2023-07-16: qty 10, 5d supply, fill #1
  Filled 2023-11-04: qty 10, 5d supply, fill #2

## 2022-12-04 ENCOUNTER — Other Ambulatory Visit (HOSPITAL_COMMUNITY): Payer: Self-pay

## 2022-12-20 ENCOUNTER — Other Ambulatory Visit (HOSPITAL_COMMUNITY): Payer: Self-pay

## 2022-12-20 MED ORDER — BISACODYL 5 MG PO TBEC
DELAYED_RELEASE_TABLET | ORAL | 0 refills | Status: AC
  Filled 2022-12-20: qty 4, 1d supply, fill #0

## 2022-12-20 MED ORDER — PEG 3350-KCL-NA BICARB-NACL 420 G PO SOLR
ORAL | 0 refills | Status: AC
  Filled 2022-12-20: qty 4000, 1d supply, fill #0

## 2022-12-26 ENCOUNTER — Other Ambulatory Visit (HOSPITAL_COMMUNITY): Payer: Self-pay

## 2022-12-28 DIAGNOSIS — K635 Polyp of colon: Secondary | ICD-10-CM | POA: Diagnosis not present

## 2022-12-28 DIAGNOSIS — K2930 Chronic superficial gastritis without bleeding: Secondary | ICD-10-CM | POA: Diagnosis not present

## 2022-12-28 DIAGNOSIS — Z1211 Encounter for screening for malignant neoplasm of colon: Secondary | ICD-10-CM | POA: Diagnosis not present

## 2022-12-28 DIAGNOSIS — K2289 Other specified disease of esophagus: Secondary | ICD-10-CM | POA: Diagnosis not present

## 2022-12-28 DIAGNOSIS — R1013 Epigastric pain: Secondary | ICD-10-CM | POA: Diagnosis not present

## 2023-01-14 ENCOUNTER — Other Ambulatory Visit (HOSPITAL_COMMUNITY): Payer: Self-pay

## 2023-01-14 MED ORDER — PANTOPRAZOLE SODIUM 40 MG PO TBEC
40.0000 mg | DELAYED_RELEASE_TABLET | Freq: Two times a day (BID) | ORAL | 3 refills | Status: DC
  Filled 2023-01-14: qty 120, 60d supply, fill #0

## 2023-01-14 MED ORDER — PANTOPRAZOLE SODIUM 40 MG PO TBEC
40.0000 mg | DELAYED_RELEASE_TABLET | Freq: Two times a day (BID) | ORAL | 3 refills | Status: AC
  Filled 2023-03-28: qty 180, 90d supply, fill #0
  Filled 2023-07-15: qty 180, 90d supply, fill #1

## 2023-01-22 ENCOUNTER — Other Ambulatory Visit (HOSPITAL_COMMUNITY): Payer: Self-pay

## 2023-01-23 ENCOUNTER — Other Ambulatory Visit (HOSPITAL_COMMUNITY): Payer: Self-pay

## 2023-01-23 MED ORDER — NORETHIN ACE-ETH ESTRAD-FE 1-20 MG-MCG PO TABS
1.0000 | ORAL_TABLET | Freq: Every day | ORAL | 2 refills | Status: DC
  Filled 2023-01-23: qty 84, 84d supply, fill #0
  Filled 2023-04-18: qty 84, 84d supply, fill #1
  Filled 2023-07-15: qty 56, 56d supply, fill #2

## 2023-01-31 ENCOUNTER — Other Ambulatory Visit (HOSPITAL_COMMUNITY): Payer: Self-pay

## 2023-02-01 ENCOUNTER — Other Ambulatory Visit (HOSPITAL_COMMUNITY): Payer: Self-pay

## 2023-02-04 DIAGNOSIS — E663 Overweight: Secondary | ICD-10-CM | POA: Diagnosis not present

## 2023-02-04 DIAGNOSIS — K111 Hypertrophy of salivary gland: Secondary | ICD-10-CM | POA: Diagnosis not present

## 2023-02-04 DIAGNOSIS — Z6828 Body mass index (BMI) 28.0-28.9, adult: Secondary | ICD-10-CM | POA: Diagnosis not present

## 2023-02-04 DIAGNOSIS — M79642 Pain in left hand: Secondary | ICD-10-CM | POA: Diagnosis not present

## 2023-02-04 DIAGNOSIS — M3501 Sicca syndrome with keratoconjunctivitis: Secondary | ICD-10-CM | POA: Diagnosis not present

## 2023-02-04 DIAGNOSIS — Z79899 Other long term (current) drug therapy: Secondary | ICD-10-CM | POA: Diagnosis not present

## 2023-02-08 ENCOUNTER — Other Ambulatory Visit (HOSPITAL_COMMUNITY): Payer: Self-pay

## 2023-03-28 ENCOUNTER — Other Ambulatory Visit (HOSPITAL_COMMUNITY): Payer: Self-pay

## 2023-04-19 ENCOUNTER — Other Ambulatory Visit: Payer: Self-pay

## 2023-05-16 DIAGNOSIS — Z79899 Other long term (current) drug therapy: Secondary | ICD-10-CM | POA: Diagnosis not present

## 2023-05-16 DIAGNOSIS — H0288A Meibomian gland dysfunction right eye, upper and lower eyelids: Secondary | ICD-10-CM | POA: Diagnosis not present

## 2023-05-16 DIAGNOSIS — H5213 Myopia, bilateral: Secondary | ICD-10-CM | POA: Diagnosis not present

## 2023-05-16 DIAGNOSIS — H52223 Regular astigmatism, bilateral: Secondary | ICD-10-CM | POA: Diagnosis not present

## 2023-05-16 DIAGNOSIS — M3501 Sicca syndrome with keratoconjunctivitis: Secondary | ICD-10-CM | POA: Diagnosis not present

## 2023-05-16 DIAGNOSIS — H0288B Meibomian gland dysfunction left eye, upper and lower eyelids: Secondary | ICD-10-CM | POA: Diagnosis not present

## 2023-05-21 ENCOUNTER — Other Ambulatory Visit (HOSPITAL_COMMUNITY): Payer: Self-pay

## 2023-05-21 MED ORDER — RESTASIS 0.05 % OP EMUL
1.0000 [drp] | Freq: Two times a day (BID) | OPHTHALMIC | 3 refills | Status: AC
  Filled 2023-05-21: qty 180, 90d supply, fill #0
  Filled 2023-09-02: qty 60, 30d supply, fill #1
  Filled 2023-09-02: qty 120, 60d supply, fill #1
  Filled 2023-11-04 – 2023-11-13 (×3): qty 180, 90d supply, fill #2

## 2023-06-17 ENCOUNTER — Other Ambulatory Visit (HOSPITAL_COMMUNITY): Payer: Self-pay

## 2023-06-17 DIAGNOSIS — I821 Thrombophlebitis migrans: Secondary | ICD-10-CM | POA: Diagnosis not present

## 2023-06-17 DIAGNOSIS — D225 Melanocytic nevi of trunk: Secondary | ICD-10-CM | POA: Diagnosis not present

## 2023-06-17 DIAGNOSIS — D485 Neoplasm of uncertain behavior of skin: Secondary | ICD-10-CM | POA: Diagnosis not present

## 2023-06-17 DIAGNOSIS — D1801 Hemangioma of skin and subcutaneous tissue: Secondary | ICD-10-CM | POA: Diagnosis not present

## 2023-06-17 MED ORDER — TYRVAYA 0.03 MG/ACT NA SOLN
1.0000 | Freq: Two times a day (BID) | NASAL | 3 refills | Status: AC
  Filled 2023-06-17 – 2023-07-01 (×2): qty 8.4, 30d supply, fill #0
  Filled 2023-11-29: qty 8.4, 30d supply, fill #1

## 2023-07-01 ENCOUNTER — Other Ambulatory Visit (HOSPITAL_COMMUNITY): Payer: Self-pay

## 2023-07-15 ENCOUNTER — Other Ambulatory Visit: Payer: Self-pay

## 2023-07-15 ENCOUNTER — Other Ambulatory Visit (HOSPITAL_COMMUNITY): Payer: Self-pay

## 2023-07-16 ENCOUNTER — Other Ambulatory Visit (HOSPITAL_COMMUNITY): Payer: Self-pay

## 2023-08-19 DIAGNOSIS — Z6828 Body mass index (BMI) 28.0-28.9, adult: Secondary | ICD-10-CM | POA: Diagnosis not present

## 2023-08-19 DIAGNOSIS — K111 Hypertrophy of salivary gland: Secondary | ICD-10-CM | POA: Diagnosis not present

## 2023-08-19 DIAGNOSIS — E663 Overweight: Secondary | ICD-10-CM | POA: Diagnosis not present

## 2023-08-19 DIAGNOSIS — M3501 Sicca syndrome with keratoconjunctivitis: Secondary | ICD-10-CM | POA: Diagnosis not present

## 2023-08-19 DIAGNOSIS — Z79899 Other long term (current) drug therapy: Secondary | ICD-10-CM | POA: Diagnosis not present

## 2023-09-02 ENCOUNTER — Other Ambulatory Visit: Payer: Self-pay

## 2023-09-02 ENCOUNTER — Other Ambulatory Visit (HOSPITAL_COMMUNITY): Payer: Self-pay

## 2023-09-02 ENCOUNTER — Encounter (HOSPITAL_COMMUNITY): Payer: Self-pay

## 2023-09-02 MED ORDER — NORETHIN ACE-ETH ESTRAD-FE 1-20 MG-MCG PO TABS
1.0000 | ORAL_TABLET | Freq: Every day | ORAL | 0 refills | Status: DC
  Filled 2023-09-02: qty 84, 84d supply, fill #0

## 2023-11-04 ENCOUNTER — Other Ambulatory Visit (HOSPITAL_COMMUNITY): Payer: Self-pay

## 2023-11-07 DIAGNOSIS — Z1231 Encounter for screening mammogram for malignant neoplasm of breast: Secondary | ICD-10-CM | POA: Diagnosis not present

## 2023-11-11 ENCOUNTER — Other Ambulatory Visit (HOSPITAL_COMMUNITY): Payer: Self-pay

## 2023-11-13 ENCOUNTER — Other Ambulatory Visit (HOSPITAL_COMMUNITY): Payer: Self-pay

## 2023-11-20 ENCOUNTER — Other Ambulatory Visit (HOSPITAL_COMMUNITY): Payer: Self-pay

## 2023-11-20 DIAGNOSIS — Z6828 Body mass index (BMI) 28.0-28.9, adult: Secondary | ICD-10-CM | POA: Diagnosis not present

## 2023-11-20 DIAGNOSIS — Z124 Encounter for screening for malignant neoplasm of cervix: Secondary | ICD-10-CM | POA: Diagnosis not present

## 2023-11-20 DIAGNOSIS — Z760 Encounter for issue of repeat prescription: Secondary | ICD-10-CM | POA: Diagnosis not present

## 2023-11-20 DIAGNOSIS — B372 Candidiasis of skin and nail: Secondary | ICD-10-CM | POA: Diagnosis not present

## 2023-11-20 DIAGNOSIS — Z1151 Encounter for screening for human papillomavirus (HPV): Secondary | ICD-10-CM | POA: Diagnosis not present

## 2023-11-20 DIAGNOSIS — Z01419 Encounter for gynecological examination (general) (routine) without abnormal findings: Secondary | ICD-10-CM | POA: Diagnosis not present

## 2023-11-20 DIAGNOSIS — R002 Palpitations: Secondary | ICD-10-CM | POA: Diagnosis not present

## 2023-11-20 MED ORDER — BLISOVI FE 1/20 1-20 MG-MCG PO TABS
1.0000 | ORAL_TABLET | Freq: Every day | ORAL | 4 refills | Status: AC
  Filled 2023-11-20 – 2024-02-10 (×2): qty 84, 84d supply, fill #0

## 2023-11-20 MED ORDER — NYSTATIN 100000 UNIT/GM EX POWD
1.0000 | Freq: Two times a day (BID) | CUTANEOUS | 1 refills | Status: AC
  Filled 2023-11-20: qty 15, 30d supply, fill #0
  Filled 2023-11-20: qty 60, 30d supply, fill #0
  Filled 2023-12-16: qty 15, 30d supply, fill #1

## 2023-11-29 ENCOUNTER — Encounter (HOSPITAL_COMMUNITY): Payer: Self-pay

## 2023-11-29 ENCOUNTER — Telehealth (HOSPITAL_COMMUNITY): Payer: Self-pay

## 2023-11-29 ENCOUNTER — Other Ambulatory Visit (HOSPITAL_COMMUNITY): Payer: Self-pay

## 2023-12-04 ENCOUNTER — Other Ambulatory Visit (HOSPITAL_COMMUNITY): Payer: Self-pay

## 2023-12-23 ENCOUNTER — Other Ambulatory Visit (HOSPITAL_COMMUNITY): Payer: Self-pay

## 2023-12-24 ENCOUNTER — Encounter: Payer: Self-pay | Admitting: Neurology

## 2023-12-24 DIAGNOSIS — Z0000 Encounter for general adult medical examination without abnormal findings: Secondary | ICD-10-CM | POA: Diagnosis not present

## 2023-12-24 DIAGNOSIS — M3501 Sicca syndrome with keratoconjunctivitis: Secondary | ICD-10-CM | POA: Diagnosis not present

## 2023-12-24 DIAGNOSIS — H9319 Tinnitus, unspecified ear: Secondary | ICD-10-CM | POA: Diagnosis not present

## 2023-12-24 DIAGNOSIS — Z131 Encounter for screening for diabetes mellitus: Secondary | ICD-10-CM | POA: Diagnosis not present

## 2023-12-24 DIAGNOSIS — K219 Gastro-esophageal reflux disease without esophagitis: Secondary | ICD-10-CM | POA: Diagnosis not present

## 2023-12-24 DIAGNOSIS — G43909 Migraine, unspecified, not intractable, without status migrainosus: Secondary | ICD-10-CM | POA: Diagnosis not present

## 2023-12-24 DIAGNOSIS — Z13220 Encounter for screening for lipoid disorders: Secondary | ICD-10-CM | POA: Diagnosis not present

## 2023-12-31 ENCOUNTER — Other Ambulatory Visit (HOSPITAL_COMMUNITY): Payer: Self-pay

## 2024-02-10 ENCOUNTER — Other Ambulatory Visit (HOSPITAL_COMMUNITY): Payer: Self-pay

## 2024-02-10 ENCOUNTER — Other Ambulatory Visit: Payer: Self-pay

## 2024-02-10 MED ORDER — PANTOPRAZOLE SODIUM 40 MG PO TBEC
40.0000 mg | DELAYED_RELEASE_TABLET | Freq: Two times a day (BID) | ORAL | 3 refills | Status: AC
  Filled 2024-02-10: qty 180, 90d supply, fill #0

## 2024-02-11 ENCOUNTER — Other Ambulatory Visit (HOSPITAL_COMMUNITY): Payer: Self-pay

## 2024-02-11 MED ORDER — HYDROXYCHLOROQUINE SULFATE 200 MG PO TABS
200.0000 mg | ORAL_TABLET | Freq: Two times a day (BID) | ORAL | 0 refills | Status: DC
  Filled 2024-02-11: qty 60, 30d supply, fill #0

## 2024-02-12 ENCOUNTER — Other Ambulatory Visit (HOSPITAL_COMMUNITY): Payer: Self-pay

## 2024-02-12 MED ORDER — HYDROXYCHLOROQUINE SULFATE 200 MG PO TABS
200.0000 mg | ORAL_TABLET | Freq: Two times a day (BID) | ORAL | 0 refills | Status: AC
  Filled 2024-02-12: qty 60, 30d supply, fill #0

## 2024-02-13 ENCOUNTER — Other Ambulatory Visit (HOSPITAL_COMMUNITY): Payer: Self-pay

## 2024-04-13 ENCOUNTER — Ambulatory Visit: Payer: Self-pay | Admitting: Neurology
